# Patient Record
Sex: Female | Born: 2000 | Race: Asian | Hispanic: No | Marital: Single | Smoking: Never smoker
Health system: Southern US, Community
[De-identification: ages and names within clinical notes are randomized; demographics above are authoritative.]

## PROBLEM LIST (undated history)

## (undated) ENCOUNTER — Ambulatory Visit (HOSPITAL_COMMUNITY): Payer: Self-pay

## (undated) DIAGNOSIS — F32A Depression, unspecified: Secondary | ICD-10-CM

## (undated) DIAGNOSIS — F419 Anxiety disorder, unspecified: Secondary | ICD-10-CM

## (undated) HISTORY — DX: Anxiety disorder, unspecified: F41.9

## (undated) HISTORY — DX: Depression, unspecified: F32.A

---
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
## 2020-01-29 ENCOUNTER — Other Ambulatory Visit: Payer: Self-pay

## 2020-01-29 ENCOUNTER — Ambulatory Visit (INDEPENDENT_AMBULATORY_CARE_PROVIDER_SITE_OTHER): Payer: Medicaid Other | Admitting: Licensed Clinical Social Worker

## 2020-01-29 DIAGNOSIS — F411 Generalized anxiety disorder: Secondary | ICD-10-CM

## 2020-01-29 DIAGNOSIS — F331 Major depressive disorder, recurrent, moderate: Secondary | ICD-10-CM | POA: Diagnosis not present
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
## 2020-02-14 ENCOUNTER — Other Ambulatory Visit: Payer: Self-pay

## 2020-02-14 ENCOUNTER — Ambulatory Visit (INDEPENDENT_AMBULATORY_CARE_PROVIDER_SITE_OTHER): Payer: Medicaid Other | Admitting: Licensed Clinical Social Worker

## 2020-02-14 DIAGNOSIS — F411 Generalized anxiety disorder: Secondary | ICD-10-CM

## 2020-02-14 DIAGNOSIS — F331 Major depressive disorder, recurrent, moderate: Secondary | ICD-10-CM | POA: Diagnosis not present

## 2020-02-14 NOTE — Progress Notes (Signed)
° °  THERAPIST PROGRESS NOTE  Session Time: 33  Therapist Response:    Subjective/Objective:  Pt was alert and oriented x 5. She was dressed neat and clean. Pt presented with euthymic mood/affect. Rebecca Odonnell was cooperative and engaged well throughout f/u therapy session.   Pt primary stressor is school. She just got done taking her finals. Pt is having trouble looking at her grades because she gets tension and worry due to her concern of failing. She understands that she can complete her classes but still struggles with finding the motivation to do them. Pt states that she is increasing from 12 credit hours this past semester to 18 in spring. This is to help make up the classes she had to drop due to failing. Pt is more confident this time around as she is completing them with her friend.   Pt currently denies suicidal ideations. She states she had them about 2 weeks ago due to family conflict. Family conflict has since be resolved and pt feels like she is getting back on track. LCSW utilized supportive therapy to help pt through her stressors in todays session by letting pt expressed her frustration in session. Rebecca Odonnell reports that she felt better after session increasing her mood from a 4 to 6.    Assessment/Plan: Pt endorse symptoms for depression for sadness, mood swings, worthlessness and hopelessness. She currently meets criteria for MDD. Plan moving forward to keep offering supportive therapy with integrated CBT to help pt process through her stressors.  Participation Level: Active  Behavioral Response: Neat and Well GroomedAlertEuthymic  Type of Therapy: Individual Therapy  Treatment Goals addressed: Diagnosis: Major depression   Interventions: CBT and Supportive  Summary: Rebecca Odonnell is a 19 y.o. female who presents with MAD & GAD.   Suicidal/Homicidal: Yeswithout intent/plan    Plan: Return again in  weeks.      Weber Cooks, LCSW 02/14/2020

## 2020-03-04 ENCOUNTER — Other Ambulatory Visit: Payer: Self-pay

## 2020-03-04 ENCOUNTER — Ambulatory Visit (INDEPENDENT_AMBULATORY_CARE_PROVIDER_SITE_OTHER): Payer: Medicaid Other | Admitting: Licensed Clinical Social Worker

## 2020-03-04 DIAGNOSIS — F502 Bulimia nervosa, unspecified: Secondary | ICD-10-CM | POA: Insufficient documentation

## 2020-03-04 DIAGNOSIS — F331 Major depressive disorder, recurrent, moderate: Secondary | ICD-10-CM

## 2020-03-04 DIAGNOSIS — F411 Generalized anxiety disorder: Secondary | ICD-10-CM
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
## 2020-04-08 ENCOUNTER — Other Ambulatory Visit: Payer: Self-pay

## 2020-04-08 ENCOUNTER — Ambulatory Visit (INDEPENDENT_AMBULATORY_CARE_PROVIDER_SITE_OTHER): Payer: Medicaid Other | Admitting: Licensed Clinical Social Worker

## 2020-04-08 DIAGNOSIS — F502 Bulimia nervosa: Secondary | ICD-10-CM

## 2020-04-08 NOTE — Progress Notes (Signed)
   THERAPIST PROGRESS NOTE  Session Time: 59  Therapist Response:     Subjective/Objective:  Pt was alert and oriented x 5. She was dressed neat and clean. Pt presented today with euthymic mood/affect. Rebecca Odonnell was cooperative and maintained good eye contact.   Pt reports primary stressor is body image. She states that this next summer she will be going to [place] to get cosmetic surgery. When LCSW asked what kinds, pt states everything and anything that her parents can afford. Rebecca Odonnell states that this is a regular practice in her culture and body image is stressed. Rebecca Odonnell still reports she is hyper focused on getting to her ideal weight of 114 lb. but states that ideal weight for her parents is 88 lbs. Pt reports not being able to get on the scale because she is afraid of what she will see. She also struggles with getting dressed in the morning because she does not like looking in the mirror.    Assessment/Plan: Pt endorse symptoms for purging, body image dysphoria, anxiety, general discontent, guilt, or mood swings. Currently she meets criteria for bulimia nervosa. She is not taking any prescribed medication currently. Plan moving forward is 1 x weekly write down a positive trait about herself to help increase her self-confidence.  Participation Level: Active  Behavioral Response: Neat and Well GroomedAlertEuthymic  Type of Therapy: Individual Therapy  Treatment Goals addressed: Diagnosis: Major depression   Interventions: CBT and Supportive  Summary: Rebecca Odonnell is a 20 y.o. female who presents with Major depression, GAD .   Suicidal/Homicidal: Nowithout intent/plan    Plan: Return again in 4 weeks.      Weber Cooks, LCSW 04/08/2020

## 2020-05-06 ENCOUNTER — Other Ambulatory Visit: Payer: Self-pay

## 2020-05-06 ENCOUNTER — Ambulatory Visit (INDEPENDENT_AMBULATORY_CARE_PROVIDER_SITE_OTHER): Payer: Medicaid Other | Admitting: Licensed Clinical Social Worker

## 2020-05-06 DIAGNOSIS — F331 Major depressive disorder, recurrent, moderate: Secondary | ICD-10-CM | POA: Diagnosis not present

## 2020-05-06 DIAGNOSIS — F411 Generalized anxiety disorder: Secondary | ICD-10-CM

## 2020-05-06 DIAGNOSIS — F502 Bulimia nervosa: Secondary | ICD-10-CM

## 2020-05-06 NOTE — Progress Notes (Signed)
   THERAPIST PROGRESS NOTE  Session Time: 62   Therapist Response:    Subjective/Objective:  Pt was alert and oriented x 5. She was dressed neat and clean. Pt presented today euthymic mood/affect. Pt was cooperative and maintained good eye contact.   Pt primary stressor has been school. She is currently maxed out on credit hours. She explains that this is not because she wants too, but because she is bored. She has all As in her classes and 1 B. 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    Assessment/Plan: Pt endorse symptoms for worthlessness, hopelessness, fatigue, irritability, tension. She currently meets criteria for Major depression and GAD. Plan moving forward will be to reach out to her instructor where she has a B, to see what she can do better on. Pt score the same score on PHQ-9 and increase on the GAD-7, however pt did rate the difficulty to the 2nd lowest level on both which is an improvement.     Participation Level: Active  Behavioral Response: Casual, Neat and Well GroomedAlertEuthymic  Type of Therapy: Individual Therapy  Treatment Goals addressed: Diagnosis: major depression   Interventions: CBT and Supportive  Summary: Rebecca Odonnell is a 20 y.o. female who presents with major depression .   Suicidal/Homicidal: NAwithout intent/plan   Plan: Return again in  weeks.    Weber Cooks, LCSW 05/06/2020

## 2020-06-06 ENCOUNTER — Ambulatory Visit (INDEPENDENT_AMBULATORY_CARE_PROVIDER_SITE_OTHER): Payer: Medicaid Other | Admitting: Licensed Clinical Social Worker

## 2020-06-06 ENCOUNTER — Other Ambulatory Visit: Payer: Self-pay

## 2020-06-06 DIAGNOSIS — F331 Major depressive disorder, recurrent, moderate: Secondary | ICD-10-CM

## 2020-06-06 DIAGNOSIS — F411 Generalized anxiety disorder: Secondary | ICD-10-CM

## 2020-06-06 NOTE — Progress Notes (Signed)
   THERAPIST PROGRESS NOTE  Session Time: 80  Therapist Response:    Subjective/Objective:  Pt was alert and oriented x 5. She was dressed casually, neat, and clean. Pt presented with anxious, depressed, and tearful mood/affect. She was cooperative and engaged well in therapy.   Primary stressor for pt is friends, and body dysmorphia. Pt will be traveling to [place] next month. She will be visiting family and plans on having plastic surgery. She wants to get a nose job, pt reports that this is something that has been talked about in her life since she was 20 years old. She states that one of her aunts is helping connect her with the Psychologist, sport and exercise. She has never met the surgeon but states she has looked up his resume. Rebecca Odonnell was tearful talking about this as LCSW encouraged pt to be educated about the process, she felt that the conversation was very similar to one she had with her mom the week prior.   Pt also reports she is doing well in school. Her friends from school have been difficulty to deal with. Rebecca Odonnell reports that they will say mean and random things to her. Rebecca Odonnell does not say anything because she beans fits from them being around with transportation and pt wants to avoid conflict.    Assessment/Plan: Pt endorse symptoms of tearfulness, worthlessness, tension, worry, irritability, restlessness, and fatigue. Pt does meet criteria for MDD and GAD. She does report body dysmorphia with her weight want to be about 20lbs lighter and wants to get plastic surgery on her nose as she is not happy with it. Rebecca Odonnell does have Hx of purging food but states she has not done this in several months. Rebecca Odonnell does meet criteria for MDD and GAD. Plan will be to purge food 0/7 days per week, pt to write three positive things about herself weekly, pt to gather all material on plastic surgery and make pros/cons list.  Participation Level: Active  Behavioral Response: Casual, Neat and Well GroomedAlertAnxious and Depressed  Type  of Therapy: Individual Therapy  Treatment Goals addressed: Diagnosis: Major depression and GAD   Interventions: CBT and Supportive  Summary: Rebecca Odonnell is a 20 y.o. female who presents with Major depression and GAD  Suicidal/Homicidal: NAwithout intent/plan    Plan: Return again in 8 weeks.     Dory Horn, LCSW 06/06/2020

## 2020-07-08 ENCOUNTER — Ambulatory Visit (HOSPITAL_COMMUNITY): Payer: Medicaid Other | Admitting: Licensed Clinical Social Worker

## 2020-08-13 ENCOUNTER — Ambulatory Visit (HOSPITAL_COMMUNITY): Payer: Medicaid Other | Admitting: Licensed Clinical Social Worker

## 2021-05-05 ENCOUNTER — Encounter (HOSPITAL_COMMUNITY): Payer: Self-pay

## 2021-05-05 ENCOUNTER — Ambulatory Visit (HOSPITAL_COMMUNITY): Payer: Medicaid Other | Admitting: Clinical

## 2022-03-03 ENCOUNTER — Encounter (HOSPITAL_COMMUNITY): Payer: Self-pay

## 2022-03-03 ENCOUNTER — Ambulatory Visit (INDEPENDENT_AMBULATORY_CARE_PROVIDER_SITE_OTHER): Payer: Medicaid Other | Admitting: Clinical

## 2022-03-03 DIAGNOSIS — F331 Major depressive disorder, recurrent, moderate: Secondary | ICD-10-CM

## 2022-03-03 NOTE — Progress Notes (Signed)
Comprehensive Clinical Assessment (CCA) Note  03/03/2022 Rebecca Odonnell [id number]  Chief Complaint:  Chief Complaint  Patient presents with   Depression   Visit Diagnosis:   MDD, recurrent episode, moderate with anxious distress   Interpretive summary:  Client is a 22 year old female presenting to the [facility] for outpatient services.  Client is presenting by self-referral for assessment.  Client reported she is having recurring symptoms of anxiety and feeling overwhelmed.  Client reported her symptoms primarily pertaining to education.  Client reported she is in her senior year of school at [employer] as she is double Glass blower/designer in Development worker, international aid.  Client reported she has a lot on her agenda to get done has difficulty with the mental process of completing her schoolwork.  Client reported the anxiety is present but it is not debilitating.  Client reported she usually stays quiet and does not feel cool to talk with her parents and/or friends about how she is feeling because she does not want to be a burden.  Client reported her workload is fine but when it comes to particularly teachers that she does not respect she does not want to complete work for them.  Client reported her teachers that show favoritism caused her to feel negatively about wanting to engage in the class.  Client reported she knows that a part of her mindset that she needs to improve.  Client reported prior history of suicidal ideations and self harming 3 to 4 years ago and has had no issues with that since then.  Client denied use of illicit substances.  Client denied prior history of inpatient treatment for mental health reasons. Client presented to the appointment oriented x 5, appropriately dressed, and friendly.  Client denied hallucinations, delusions, suicidal and homicidal ideations.  Client was screened for pain, nutrition, [place] suicide severity and the following S DOH:     03/03/2022   11:21 AM 05/06/2020    2:19 PM 11/29/2019   11:10 AM  GAD 7 : Generalized Anxiety Score  Nervous, Anxious, on Edge 1 3 1   Control/stop worrying 1 2 2   Worry too much - different things 1 1 1   Trouble relaxing 1 2 2   Restless 1 0 2  Easily annoyed or irritable 1 3 2   Afraid - awful might happen 1 2 1   Total GAD 7 Score 7 13 11   Anxiety Difficulty Somewhat difficult Somewhat difficult Very difficult     Flowsheet Row Counselor from 05/06/2020 in [facility]  PHQ-9 Total Score 16        Tx Recommendations: counseling and medication management   CCA Biopsychosocial Intake/Chief Complaint:  Client reported she is presenting by self-referral.  Client is presenting with symptoms related to anxiety and stress.  Current Symptoms/Problems: Client reported feeling overwhelmed  Patient Reported Schizophrenia/Schizoaffective Diagnosis in Past: No  Strengths: vocalizing problems and needs  Preferences: Individual therapy  Abilities: Able to contribute to problem solving to plan resolution  Type of Services Patient Feels are Needed: therapy  Initial Clinical Notes/Concerns: No data recorded  Mental Health Symptoms Depression:   None   Duration of Depressive symptoms: No data recorded  Mania:   None   Anxiety:    Tension; Worrying; Difficulty concentrating   Psychosis:   None   Duration of Psychotic symptoms: No data recorded  Trauma:   None   Obsessions:   None   Compulsions:   None   Inattention:   None  Hyperactivity/Impulsivity:   None   Oppositional/Defiant Behaviors:   None   Emotional Irregularity:   None   Other Mood/Personality Symptoms:  No data recorded   Mental Status Exam Appearance and self-care  Stature:   Average   Weight:   Average weight   Clothing:   Casual   Grooming:   Normal   Cosmetic use:   Age appropriate   Posture/gait:   Normal   Motor activity:   Not Remarkable    Sensorium  Attention:   Normal   Concentration:   Normal   Orientation:   X5   Recall/memory:   Normal   Affect and Mood  Affect:   Congruent   Mood:   Euthymic   Relating  Eye contact:   None   Facial expression:   Anxious   Attitude toward examiner:   Cooperative   Thought and Language  Speech flow:  Clear and Coherent   Thought content:   Appropriate to Mood and Circumstances   Preoccupation:   None   Hallucinations:   None   Organization:  No data recorded  [employer] of Knowledge:   Good   Intelligence:   Average   Abstraction:   Normal   Judgement:   Good   Reality Testing:   Adequate   Insight:   Good   Decision Making:   Normal   Social Functioning  Social Maturity:   Responsible   Social Judgement:   Normal   Stress  Stressors:   School   Coping Ability:   Normal   Skill Deficits:   Activities of daily living   Supports:   Friends/Service system; Family     Religion: Religion/Spirituality Are You A Religious Person?: No  Leisure/Recreation: Leisure / Recreation Do You Have Hobbies?: Yes  Exercise/Diet: Exercise/Diet Do You Exercise?: Yes Have You Gained or Lost A Significant Amount of Weight in the Past Six Months?: No Do You Follow a Special Diet?: No Do You Have Any Trouble Sleeping?: Yes   CCA Employment/Education Employment/Work Situation: Employment / Work Situation Employment Situation: Ship broker  Education: Education Is Patient Currently Attending School?: Yes School Currently Attending: [employer] and political science Did Teacher, adult education From [employer]?: Yes Did Physicist, medical?: Yes   CCA Family/Childhood History Family and Relationship History: Family history Marital status: Single Does patient have children?: No  Childhood History:  Childhood History By whom was/is the patient raised?: Both parents Additional childhood history information: Client  reported she was born and raised in [place].  Client reported she has "strict" Asian parents.  Client reported she has a distant relationship with her father.  Client reported she does not speak Bhimani's very well and he does not speak much English so there may able to communicate with each other as she would like.  Client reported her mother imposes a lot of standards such as weight, appears to, employment and education.  Client reported she does have some good moments with her mother. Does patient have siblings?: Yes Number of Siblings: 1 Description of patient's current relationship with siblings: Client reported she has a cold or brother from her mom.  Client reported they are 9 years apart and do not have a close relationship. Did patient suffer any verbal/emotional/physical/sexual abuse as a child?: No Did patient suffer from severe childhood neglect?: No Has patient ever been sexually abused/assaulted/raped as an adolescent or adult?: No Was the patient ever a victim of a crime or a disaster?: No  Witnessed domestic violence?: No Has patient been affected by domestic violence as an adult?: No  Child/Adolescent Assessment:     CCA Substance Use Alcohol/Drug Use: Alcohol / Drug Use History of alcohol / drug use?: No history of alcohol / drug abuse                         ASAM's:  Six Dimensions of Multidimensional Assessment  Dimension 1:  Acute Intoxication and/or Withdrawal Potential:      Dimension 2:  Biomedical Conditions and Complications:      Dimension 3:  Emotional, Behavioral, or Cognitive Conditions and Complications:     Dimension 4:  Readiness to Change:     Dimension 5:  Relapse, Continued use, or Continued Problem Potential:     Dimension 6:  Recovery/Living Environment:     ASAM Severity Score:    ASAM Recommended Level of Treatment:     Substance use Disorder (SUD)    Recommendations for Services/Supports/Treatments: Recommendations for  Services/Supports/Treatments Recommendations For Services/Supports/Treatments: Individual Therapy  DSM5 Diagnoses: Patient Active Problem List   Diagnosis Date Noted   Bulimia nervosa 03/04/2020   Major depressive disorder, recurrent episode, moderate ([place]) 11/29/2019   GAD (generalized anxiety disorder) 11/29/2019    Patient Centered Plan: Patient is on the following Treatment Plan(s):  Depression   Referrals to Alternative Service(s): Referred to Alternative Service(s):   Place:   Date:   Time:    Referred to Alternative Service(s):   Place:   Date:   Time:    Referred to Alternative Service(s):   Place:   Date:   Time:    Referred to Alternative Service(s):   Place:   Date:   Time:      Collaboration of Care: Referral or follow-up with counselor/therapist AEB [facility]  Patient/Guardian was advised Release of Information must be obtained prior to any record release in order to collaborate their care with an outside provider. Patient/Guardian was advised if they have not already done so to contact the registration department to sign all necessary forms in order for [place] to release information regarding their care.   Consent: Patient/Guardian gives verbal consent for treatment and assignment of benefits for services provided during this visit. Patient/Guardian expressed understanding and agreed to proceed.   [place], LCSW

## 2022-03-30 ENCOUNTER — Ambulatory Visit (INDEPENDENT_AMBULATORY_CARE_PROVIDER_SITE_OTHER): Payer: Medicaid Other | Admitting: Clinical

## 2022-03-30 DIAGNOSIS — F411 Generalized anxiety disorder: Secondary | ICD-10-CM

## 2022-03-30 NOTE — Progress Notes (Signed)
   THERAPIST PROGRESS NOTE  Session Time: 30 minutes  Participation Level: Active  Behavioral Response: CasualAlertEuthymic  Type of Therapy: Individual Therapy  Treatment Goals addressed: client will practice problem solving skills 3 times per week for the next 4 weeks  ProgressTowards Goals: Progressing  Interventions: CBT and Supportive  Summary:  Rebecca Odonnell is a 22 y.o. adult who presents for scheduled appointment oriented x 5, appropriately dressed, and friendly.  Client denied hallucinations or delusions. Client reported on today she is feeling tired. Client reported she was up all night doing schoolwork.  Client reported she is scheduled to graduate in the spring but has decided to extend for 1 more semester to help improve her GPA.  Client reported she has decided not to look at her GPA because she does not want to stress herself out. Client reported living with her parents is her very stressful and she has decided post graduation she will not have contact with her parents at some point. Client reported they provide her various things but in a way that causes her to feel like she is in get to them and needs to pay them back.  Client reported she understands the logic with being immigrants but still does not agree with their logic. Client reported she has a resentment towards her parents.  Client reported she does want to work to be able to pay her parents money so I cannot say that she owes them anything.  Client reported she would like to start working but her school schedule is not refined in a way to where she feels organized between having a balance. Evidence of progress towards goal:  client reported 1 cognitive belief that contributes to her symptoms of depression and/or anxiety related to family dynamic.   Suicidal/Homicidal: Nowithout intent/plan  Therapist Response:  Therapist began the appointment asking the client how she has been doing since last seen. Therapist used  CBT to engage using active listening and positive emotional support. Therapist used CBT to engage and ask the client about her school and life balance. Therapist used CBT to ask the client to describe how her childhood/ current family dynamic contribute to her overall daily functioning. Therapist used CBT to discus time management and normalize the clients emotions and future goals. Therapist used CBT ask the client to identify her progress with frequency of use with coping skills with continued practice in her daily activity.    Therapist assigned the client homework to write out her school class schedule. Client was scheduled for next appointment.   Plan: Return again in 3 weeks.  Diagnosis: generalized anxiety disorder  Collaboration of Care: Patient refused AEB none requested by the client.  Patient/Guardian was advised Release of Information must be obtained prior to any record release in order to collaborate their care with an outside provider. Patient/Guardian was advised if they have not already done so to contact the registration department to sign all necessary forms in order for [place] to release information regarding their care.   Consent: Patient/Guardian gives verbal consent for treatment and assignment of benefits for services provided during this visit. Patient/Guardian expressed understanding and agreed to proceed.   [place], LCSW 03/30/2022

## 2022-04-13 ENCOUNTER — Ambulatory Visit (HOSPITAL_COMMUNITY): Payer: Medicaid Other | Admitting: Clinical

## 2022-04-29 ENCOUNTER — Ambulatory Visit (INDEPENDENT_AMBULATORY_CARE_PROVIDER_SITE_OTHER): Payer: Medicaid Other | Admitting: Clinical

## 2022-04-29 DIAGNOSIS — F411 Generalized anxiety disorder: Secondary | ICD-10-CM | POA: Diagnosis not present

## 2022-04-29 NOTE — Progress Notes (Signed)
   THERAPIST PROGRESS NOTE  Session Time: 40 minutes  Participation Level: Active  Behavioral Response: CasualAlertAnxious  Type of Therapy: Individual Therapy  Treatment Goals addressed: client will practice problem solving skills 3 times per week for the next 4 weeks  ProgressTowards Goals: Progressing  Interventions: CBT and Supportive  Summary:  Rebecca Odonnell is a 22 y.o. adult who presents for the scheduled appointment oriented x 5, appropriately dressed, and friendly.  Client denied hallucinations and delusions. Client reported on today she has not been doing so well.  Client reported she has been having some symptoms for a while and decided to go to the doctor about it.  Client reported her doctors found that she had a hormonal imbalance and could possibly have the diagnosis of her PCOS.  Client reported she has follow-up appointments regarding that.  Client reported she did inform her mother but her mother gave her the concern of not getting a medication that would cause her to gain weight.  Client reported her mother also had someone to reach out to her to advise her to take Ozempic because it would help with maintaining her weight.  Client reported it was not a surprise but she expressed her annoying about violating her personal information and placing her concern in the wrong place regarding her weight as opposed to her overall health.  Client reported otherwise majority of her classes she will pass but there is one class in particular that she has had difficulty with the motivation to go through.  Client reported she plans on taking classes in the summertime to help her GPA. Evidence of progress towards goal: Client reported 1 positive of following through with steps to manage her health.  Client also reported 1 cognitive pattern that interferes with her school performance negatively.  Suicidal/Homicidal: Nowithout intent/plan  Therapist Response:  Therapist began the appointment  asking the client how she has been doing since last seen. Therapist used CBT to engage using active listening and positive emotional support. Therapist used CBT to give the client time to discuss stressors regarding her health, family, and school. Therapist used CBT to positively reinforce the client's follow-through with recommendations to improve her health. Therapist used CBT to engage in helping the client to reframe thoughts about school to encourage her to finish the semester out the best of her ability. Therapist used CBT ask the client to identify her progress with frequency of use with coping skills with continued practice in her daily activity.    Therapist assigned client homework to practice self-care.    Plan: Return again in 3 weeks.  Diagnosis: GAD  Collaboration of Care: Patient refused AEB none requested by the client.  Patient/Guardian was advised Release of Information must be obtained prior to any record release in order to collaborate their care with an outside provider. Patient/Guardian was advised if they have not already done so to contact the registration department to sign all necessary forms in order for [place] to release information regarding their care.   Consent: Patient/Guardian gives verbal consent for treatment and assignment of benefits for services provided during this visit. Patient/Guardian expressed understanding and agreed to proceed.   [place], LCSW 04/29/2022

## 2022-05-21 ENCOUNTER — Ambulatory Visit (INDEPENDENT_AMBULATORY_CARE_PROVIDER_SITE_OTHER): Payer: Medicaid Other | Admitting: Clinical

## 2022-05-21 DIAGNOSIS — F331 Major depressive disorder, recurrent, moderate: Secondary | ICD-10-CM

## 2022-05-21 NOTE — Progress Notes (Signed)
THERAPIST PROGRESS NOTE Virtual Visit via Video Note  I connected with Rebecca Odonnell on 05/21/2022 at  9:00 AM EDT by a video enabled telemedicine application and verified that I am speaking with the correct person using two identifiers.  Location: Patient: home Provider: office   I discussed the limitations of evaluation and management by telemedicine and the availability of in person appointments. The patient expressed understanding and agreed to proceed.   Follow Up Instructions: I discussed the assessment and treatment plan with the patient. The patient was provided an opportunity to ask questions and all were answered. The patient agreed with the plan and demonstrated an understanding of the instructions.   The patient was advised to call back or seek an in-person evaluation if the symptoms worsen or if the condition fails to improve as anticipated.    Session Time: 45 minutes  Participation Level: Active  Behavioral Response: CasualAlertAnxious  Type of Therapy: Individual Therapy  Treatment Goals addressed: client will practice problem solving skills 3 times per week for the next 4 weeks  ProgressTowards Goals: Progressing  Interventions: CBT and Supportive  Summary:  Rebecca Odonnell is a 22 y.o. adult who presents for the scheduled appointment oriented x 5, appropriately dressed, and friendly.  Client denied hallucinations and delusions. Client reported on today she has been working client studying for the LSAT.  Client reported she has 3 weeks until she officially takes the test.  Client reported she has been spending several hours a day doing practice test and she is nervous about how she will perform with the time constraints as well as which sections will be created at which point will not be.  Client reported her parents have passed judgment about her just being able to stay in it correlating to her ability to obviously do well on the test.  Client reported that her  judgments and/or expectations can take away from the joy of her wanting to study law for herself.  Client reported she is currently fighting with her family.  Client reported her maternal grandmother is coming to visit from [place].  Client reported her grandma has always said negative things about her such as her appearance and her weight.  Client reported its never been set directly to her by her grandmother but she has heard from other people in the family.  Client reported she will also need to have a surgery to remove a mass from her breast.  Client reported she does not like that her family only shows some type of concern about her wellbeing when she is hurt. Evidence of progress towards goal: Client reported she practices 1 skill of reframing her negative thoughts with reminding herself of her own educational including goals.   Suicidal/Homicidal: Nowithout intent/plan  Therapist Response:  Therapist began the appointment asking the client how she has been doing. Therapist used CBT to engage using active listening and positive emotional support. Therapist used CBT to engage and give the client time to discuss her thoughts and feeling related to family, school, and her future. Therapist used CBT to engage and normalize her emotions and discuss reframing negative thoughts. Therapist used CBT ask the client to identify her progress with frequency of use with coping skills with continued practice in her daily activity.    Therapist assigned the client homework to practice self care.    Plan: Return again in 3 weeks.  Diagnosis: mdd, recurrent episode, moderate with anxious distress  Collaboration of Care: Patient refused AEB none  requested by the client.  Patient/Guardian was advised Release of Information must be obtained prior to any record release in order to collaborate their care with an outside provider. Patient/Guardian was advised if they have not already done so to contact the  registration department to sign all necessary forms in order for [place] to release information regarding their care.   Consent: Patient/Guardian gives verbal consent for treatment and assignment of benefits for services provided during this visit. Patient/Guardian expressed understanding and agreed to proceed.   [place], LCSW 05/21/2022

## 2022-06-05 ENCOUNTER — Ambulatory Visit (HOSPITAL_COMMUNITY): Payer: Medicaid Other | Admitting: Psychiatry
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
## 2022-06-15 ENCOUNTER — Encounter (HOSPITAL_COMMUNITY): Payer: Self-pay | Admitting: Psychiatry

## 2022-06-15 ENCOUNTER — Ambulatory Visit (INDEPENDENT_AMBULATORY_CARE_PROVIDER_SITE_OTHER): Payer: Medicaid Other | Admitting: Psychiatry

## 2022-06-15 VITALS — Wt 147.0 lb

## 2022-06-15 DIAGNOSIS — G478 Other sleep disorders: Secondary | ICD-10-CM

## 2022-06-15 DIAGNOSIS — F5089 Other specified eating disorder: Secondary | ICD-10-CM | POA: Diagnosis not present

## 2022-06-15 DIAGNOSIS — F329 Major depressive disorder, single episode, unspecified: Secondary | ICD-10-CM | POA: Diagnosis not present

## 2022-06-15 DIAGNOSIS — F411 Generalized anxiety disorder: Secondary | ICD-10-CM | POA: Diagnosis not present

## 2022-06-15 MED ORDER — MIRTAZAPINE 7.5 MG PO TABS: 7.5000 mg | ORAL_TABLET | Freq: Every day | ORAL | 2 refills | Status: DC

## 2022-06-15 NOTE — Patient Instructions (Signed)
Thank you for attending your appointment today.  -- START Remeron 7.5 mg nightly -- Continue other medications as prescribed.  Please do not make any changes to medications without first discussing with your provider. If you are experiencing a psychiatric emergency, please call 911 or present to your nearest emergency department. Additional crisis, medication management, and therapy resources are included below.  [facility]  [address] [place], [place] [address] [phone] WALK-IN URGENT CARE 24/7 FOR ANYONE [address] [place], [place]  [phone] Fax: [phone] [email or web address] *Interpreters available *Accepts all insurance and uninsured for Urgent Care needs *Accepts Medicaid and uninsured for outpatient treatment (below)      ONLY FOR [facility]  Below:    Outpatient New Patient Assessment/Therapy Walk-ins:        Monday -Thursday 8am until slots are full.        Every Friday 1pm-4pm  (first come, first served)                   New Patient Psychiatry/Medication Management        Monday-Friday 8am-11am (first come, first served)               For all walk-ins we ask that you arrive by 7:15am, because patients will be seen in the order of arrival.

## 2022-06-16 ENCOUNTER — Ambulatory Visit (INDEPENDENT_AMBULATORY_CARE_PROVIDER_SITE_OTHER): Payer: Medicaid Other | Admitting: Clinical

## 2022-06-16 DIAGNOSIS — F331 Major depressive disorder, recurrent, moderate: Secondary | ICD-10-CM

## 2022-06-16 NOTE — Progress Notes (Signed)
THERAPIST PROGRESS NOTE Virtual Visit via Video Note  I connected with Rebecca Odonnell on 06/16/22 at  9:00 AM EDT by a video enabled telemedicine application and verified that I am speaking with the correct person using two identifiers.  Location: Patient: home Provider: office   I discussed the limitations of evaluation and management by telemedicine and the availability of in person appointments. The patient expressed understanding and agreed to proceed.   Follow Up Instructions: I discussed the assessment and treatment plan with the patient. The patient was provided an opportunity to ask questions and all were answered. The patient agreed with the plan and demonstrated an understanding of the instructions.   The patient was advised to call back or seek an in-person evaluation if the symptoms worsen or if the condition fails to improve as anticipated.   Session Time: 40 minutes  Participation Level: Active  Behavioral Response: CasualAlertEuthymic  Type of Therapy: Individual Therapy  Treatment Goals addressed: client will practice problem solving skills 3 times per week for the next 4 weeks  ProgressTowards Goals: Progressing  Interventions: CBT and Supportive  Summary:  Rebecca Odonnell is a 22 y.o. adult who presents for the scheduled appointment oriented times five, appropriately dressed and friendly. Client denied hallucinations and delusions. Client reported she is doing fairly okay. Client reported she went to take her LSAT. Client reported she walked away from it feeling like she did better on the test than she expected. Client reported she is not expecting an amazing score but she feels it wont be her worst score. Client reported she got into a little argument with her parents before she took the exam. Client reported she feels dissociated from her parents. Client reported one of her issues with her parents is that they do not remember important dates for her. Client reported  they did not give positive acknowledgement of her testing and they do not remember her other health appointments. Client reported she has decided to volunteer and has been helping with [place] [employer] and an organization that hands out good to the homeless. 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 Client reported in doing so she has no plan of maintaining a relationship with her parents and has also considered cutting her relationship with her brother an cousins because she does not want her parents to have access to knowing about her life. Client reported the relationship with her brother and cousins is already distant. Client reported she does not like being snappy with her parents but living with them does more harm to her mental well being. Client reported she does not want to stay in state. Client reported if she ended up staying in [place] she would use her time to "figure out how to leave or kill myself". Client reported she does not have plan to harm herself right now and feels very optimistic about her ability to leave.  Evidence of progress towards goal:  Client reported 1 positive goal of planning for her future career and school goals.  Suicidal/Homicidal: Nowithout intent/plan  Therapist Response:  Therapist began the appointment asking the client how she has been doing since she was last seen. Therapist used CBT to engage using active listening and positive emotional support. Therapist used CBT to engage and ask the client open ended questions about how her testing went and her thought process working through school. Therapist used CBT to engage ask the client  how she is coping and thinking through stressors with her family and how it will affect her future. Therapist used CBT to engage and discuss stressors contributing to passive S.I and appropriately coping with thoughts/ emotions of hopelessness and anxiety. Therapist used  CBT to reinforce positive coping skills and problem solving skills. Therapist used CBT ask the client to identify her progress with frequency of use with coping skills with continued practice in her daily activity.    Therapist assigned the client homework to practice self care.    Plan: Return again in 3 weeks.  Diagnosis: major depressive disorder, recurrent episode, moderate with anxious distress  Collaboration of Care: Patient refused AEB none requested by the client.  Patient/Guardian was advised Release of Information must be obtained prior to any record release in order to collaborate their care with an outside provider. Patient/Guardian was advised if they have not already done so to contact the registration department to sign all necessary forms in order for [place] to release information regarding their care.   Consent: Patient/Guardian gives verbal consent for treatment and assignment of benefits for services provided during this visit. Patient/Guardian expressed understanding and agreed to proceed.   Neena Rhymes Hailly Fess, LCSW 06/16/2022

## 2022-07-01 ENCOUNTER — Ambulatory Visit (INDEPENDENT_AMBULATORY_CARE_PROVIDER_SITE_OTHER): Payer: Medicaid Other | Admitting: Clinical

## 2022-07-01 DIAGNOSIS — F331 Major depressive disorder, recurrent, moderate: Secondary | ICD-10-CM | POA: Diagnosis not present
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
## 2022-07-15 ENCOUNTER — Ambulatory Visit (HOSPITAL_COMMUNITY): Payer: Medicaid Other | Admitting: Clinical

## 2022-07-15 ENCOUNTER — Encounter (HOSPITAL_COMMUNITY): Payer: Self-pay | Admitting: Psychiatry

## 2022-07-15 ENCOUNTER — Telehealth (INDEPENDENT_AMBULATORY_CARE_PROVIDER_SITE_OTHER): Payer: Medicaid Other | Admitting: Psychiatry

## 2022-07-15 DIAGNOSIS — F5089 Other specified eating disorder: Secondary | ICD-10-CM | POA: Diagnosis not present

## 2022-07-15 DIAGNOSIS — F331 Major depressive disorder, recurrent, moderate: Secondary | ICD-10-CM | POA: Diagnosis not present

## 2022-07-15 DIAGNOSIS — F411 Generalized anxiety disorder: Secondary | ICD-10-CM | POA: Diagnosis not present

## 2022-07-15 MED ORDER — MIRTAZAPINE 15 MG PO TABS: 15.0000 mg | ORAL_TABLET | Freq: Every day | ORAL | 2 refills | Status: DC

## 2022-07-15 NOTE — Patient Instructions (Signed)
Thank you for attending your appointment today.  -- INCREASE Remeron to 15 mg nightly -- Continue other medications as prescribed.  Please do not make any changes to medications without first discussing with your provider. If you are experiencing a psychiatric emergency, please call 911 or present to your nearest emergency department. Additional crisis, medication management, and therapy resources are included below.  [facility]  [address], [place], [place] [address] [phone] WALK-IN URGENT CARE 24/7 FOR ANYONE [address] [place], [place]  [phone] Fax: [phone] [email or web address] *Interpreters available *Accepts all insurance and uninsured for Urgent Care needs *Accepts Medicaid and uninsured for outpatient treatment (below)      ONLY FOR [facility]  Below:    Outpatient New Patient Assessment/Therapy Walk-ins:        Monday -Thursday 8am until slots are full.        Every Friday 1pm-4pm  (first come, first served)                   New Patient Psychiatry/Medication Management        Monday-Friday 8am-11am (first come, first served)               For all walk-ins we ask that you arrive by 7:15am, because patients will be seen in the order of arrival.
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
## 2022-07-30 ENCOUNTER — Ambulatory Visit (HOSPITAL_COMMUNITY): Payer: Medicaid Other | Admitting: Clinical

## 2022-07-30 ENCOUNTER — Encounter (HOSPITAL_COMMUNITY): Payer: Self-pay

## 2022-08-18 ENCOUNTER — Ambulatory Visit (INDEPENDENT_AMBULATORY_CARE_PROVIDER_SITE_OTHER): Payer: Medicaid Other | Admitting: Clinical

## 2022-08-18 ENCOUNTER — Encounter (HOSPITAL_COMMUNITY): Payer: Self-pay

## 2022-08-18 DIAGNOSIS — F331 Major depressive disorder, recurrent, moderate: Secondary | ICD-10-CM

## 2022-08-18 NOTE — Progress Notes (Signed)
THERAPIST PROGRESS NOTE Virtual Visit via Video Note  I connected with Arleane Spinale on 08/18/22 at  2:00 PM EDT by a video enabled telemedicine application and verified that I am speaking with the correct person using two identifiers.  Location: Patient: home Provider: office   I discussed the limitations of evaluation and management by telemedicine and the availability of in person appointments. The patient expressed understanding and agreed to proceed.   Follow Up Instructions: I discussed the assessment and treatment plan with the patient. The patient was provided an opportunity to ask questions and all were answered. The patient agreed with the plan and demonstrated an understanding of the instructions.   The patient was advised to call back or seek an in-person evaluation if the symptoms worsen or if the condition fails to improve as anticipated.   Session Time: 45 minutes  Participation Level: Active  Behavioral Response: CasualAlertDepressed  Type of Therapy: Individual Therapy  Treatment Goals addressed: client will practice problem solving skills 3 times per week for the next 4 weeks  ProgressTowards Goals: Progressing  Interventions: CBT and Supportive  Summary:  Nuzhat Dagle is a 22 y.o. adult who presents for the scheduled appointment oriented times five, appropriately dressed and friendly. Client denied hallucinations and delusions. Client reported on today she is doing fairly okay. Client reported she is on medications that make her drowsy which is why she missed her last therapy appointment. Client reported her grandmother and female cousin came to visit and left. Client reported ultimately she did not have to agree to marry him to help him stay in the country. Client reported she blocked him from being able to contact her again. Client reported her mother recently returned from [place] and thinks she was influenced from seeing her friends having good relationships  with their children. Client reported her mother started an argument about why she never tells her she loves her and other things. Client reported she has lost affection for her parents due to their parenting style. Client reported she understands that they didn't mean for he to feel so little of herself. Client reported it is because of how they were raised. Client reported she has been brainstorming how she will cut communication from her parents when the time comes that she leaves. Client reported otherwise she has been taking care of her health. Client reported she was diagnosed with a specific form of lupus. Client reported she is being managed on medication and doing well with that. Evidence of progress towards goal:  client reported 1 positive which is effectively applying boundaries and communication to help with conflict resolution.  Suicidal/Homicidal: Nowithout intent/plan  Therapist Response:  Therapist began the appointment asking the client how she has been doing. Therapist used CBT to engage using active listening and positive emotional support. Therapist used CBT to ask the client open ended questions about psychosocial stressors and its affect on her thoughts and emotions. Therapist used CBT to engage and ask her to describe how she is processing and coping with family and school stressors. Therapist used CBT to reinforce boundaries and positive self esteem. Therapist used CBT ask the client to identify her progress with frequency of use with coping skills with continued practice in her daily activity.    Therapist assigned the client homework to practice self care.    Plan: Return again in 3 weeks.  Diagnosis: major depressive disorder, recurrent episode, moderate  Collaboration of Care: Patient refused AEB none requested by the client.  Patient/Guardian was advised Release of Information must be obtained prior to any record release in order to collaborate their care with an  outside provider. Patient/Guardian was advised if they have not already done so to contact the registration department to sign all necessary forms in order for [place] to release information regarding their care.   Consent: Patient/Guardian gives verbal consent for treatment and assignment of benefits for services provided during this visit. Patient/Guardian expressed understanding and agreed to proceed.   Neena Rhymes Maykayla Highley, LCSW 08/18/2022

## 2022-08-26 ENCOUNTER — Encounter (HOSPITAL_COMMUNITY): Payer: Self-pay

## 2022-09-02 ENCOUNTER — Telehealth (HOSPITAL_COMMUNITY): Payer: Medicaid Other | Admitting: Psychiatry

## 2022-09-09 ENCOUNTER — Ambulatory Visit (INDEPENDENT_AMBULATORY_CARE_PROVIDER_SITE_OTHER): Payer: Medicaid Other | Admitting: Clinical

## 2022-09-09 DIAGNOSIS — F331 Major depressive disorder, recurrent, moderate: Secondary | ICD-10-CM | POA: Diagnosis not present

## 2022-09-10 NOTE — Progress Notes (Signed)
THERAPIST PROGRESS NOTE Virtual Visit via Video Note  I connected with Rebecca Odonnell on 09/09/2022 at  3:00 PM EDT by a video enabled telemedicine application and verified that I am speaking with the correct person using two identifiers.  Location: Patient: home Provider: office   I discussed the limitations of evaluation and management by telemedicine and the availability of in person appointments. The patient expressed understanding and agreed to proceed.   Follow Up Instructions: I discussed the assessment and treatment plan with the patient. The patient was provided an opportunity to ask questions and all were answered. The patient agreed with the plan and demonstrated an understanding of the instructions.   The patient was advised to call back or seek an in-person evaluation if the symptoms worsen or if the condition fails to improve as anticipated.    Session Time: 45 minutes  Participation Level: Active  Behavioral Response: CasualAlertDepressed  Type of Therapy: Individual Therapy  Treatment Goals addressed: client will practice problem solving skills 3 times per week for the next 4 weeks  ProgressTowards Goals: Progressing  Interventions: CBT and Supportive  Summary:  Rebecca Odonnell is a 22 y.o. adult who presents for the scheduled appointment oriented times five, appropriately dressed and friendly. Client denied hallucinations and delusions. Client reported she has still had a mix of emotions between anxious and depressed. Client reported she found out her brother was cheating on his girlfriend whom she'd become find of and it has changed her perspective of him. Client reported this news has also changed her parents to beng nicer to her. Client reported it has  given her some momentary hesitation about going no contact from them. Client reported her plan is to move states when she goes to graduate school and not tell her parents. Client reported she has mixed emotions of  depression, anxiety and grief while transitioning to the mindset that she will not be around them ne day. Client reported although she has no emotional affection for her parents she still worries about how they will be cared for as they do not speak fluent english. Client reported her depression also comes from the realization that she feels orphaned in a way.  Evidence of progress towards goal:  client reported she use 3 journals to write out her triggers and thoughts/ emotions.  Suicidal/Homicidal: Nowithout intent/plan  Therapist Response:  Therapist began the appointment asking the client how she has been doing. Therapist used CBT to engage using active listening and positive emotional support. Therapist used CBT to engage and give the client time to discuss her thoughts and feelings about family. Therapist used CBT to discuss making logical decisions about her future. Therapist used CBT ask the client to identify her progress with frequency of use with coping skills with continued practice in her daily activity.    Therapist assigned the client homework to consider pros and cons of her decisions she is contemplating.  Plan: Return again in 4 weeks.  Diagnosis: major depressive disorder, recurrent episode, moderate  Collaboration of Care: Patient refused AEB none requested by the client.  Patient/Guardian was advised Release of Information must be obtained prior to any record release in order to collaborate their care with an outside provider. Patient/Guardian was advised if they have not already done so to contact the registration department to sign all necessary forms in order for [place] to release information regarding their care.   Consent: Patient/Guardian gives verbal consent for treatment and assignment of benefits for services provided  during this visit. Patient/Guardian expressed understanding and agreed to proceed.   Neena Rhymes Paulette Rockford, LCSW 09/09/2022

## 2022-10-15 ENCOUNTER — Encounter (HOSPITAL_COMMUNITY): Payer: Self-pay

## 2022-10-15 ENCOUNTER — Ambulatory Visit (HOSPITAL_COMMUNITY): Payer: Medicaid Other | Admitting: Clinical

## 2022-11-12 ENCOUNTER — Ambulatory Visit (INDEPENDENT_AMBULATORY_CARE_PROVIDER_SITE_OTHER): Payer: Medicaid Other | Admitting: Clinical

## 2022-11-12 DIAGNOSIS — F331 Major depressive disorder, recurrent, moderate: Secondary | ICD-10-CM | POA: Diagnosis not present

## 2022-11-12 NOTE — Progress Notes (Signed)
THERAPIST PROGRESS NOTE Virtual Visit via Video Note  I connected with Rebecca Odonnell on 11/12/2022 at 11:00 AM EDT by a video enabled telemedicine application and verified that I am speaking with the correct person using two identifiers.  Location: Patient: Home Provider: Office   I discussed the limitations of evaluation and management by telemedicine and the availability of in person appointments. The patient expressed understanding and agreed to proceed.   Follow Up Instructions: I discussed the assessment and treatment plan with the patient. The patient was provided an opportunity to ask questions and all were answered. The patient agreed with the plan and demonstrated an understanding of the instructions.   The patient was advised to call back or seek an in-person evaluation if the symptoms worsen or if the condition fails to improve as anticipated.  Session Time: 45 minutes  Participation Level: Active  Behavioral Response: CasualAlertEuthymic  Type of Therapy: Individual Therapy  Treatment Goals addressed: Client will practice problem-solving skills that is also reported as 4 weeks  ProgressTowards Goals: Progressing  Interventions: CBT and Supportive  Summary:  Rebecca Odonnell is a 22 y.o. adult who presents for the scheduled appointment oriented x 5, appropriately dressed, and friendly.  Client denied hallucinations and delusions. Client reported on today a lot has been going on.  Client reported her birthday is coming up and it is always a difficult time.  Client reported she usually gets the "birthday blues".  Client reported as a child her parents did not celebrate birthdays.  Client reported that a traditional way of celebrating his to not do so.  Client reported it makes her sad around her birthday because she already does not feel seen by her family.  Client reported she has tried to enlighten her friends on how to support her during her birthdays because she does not  want to feel the same way by them on her birthday.  Client reported she is not sure how much will be to much to talk to her friends to celebrate her birthday without putting unrealistic expectations on them. Client reported she has a long distance female friend who has mental health history. Client reported having thoughts that he is being reliant upon her to be in a good mental space. Client reported her friends have told her she needs to stop talking to him. Evidence of progress towards goal: Client reported 1 positive which is considering identifying her unrealistic or negative thinking styles and reframing them so they do not interfere with her interpersonal relationships negatively.  Suicidal/Homicidal: Nowithout intent/plan  Therapist Response:  Therapist began the appointment asking the client how she has been doing since last seen. Therapist used CBT to engage using active listening and positive emotional support towards her thoughts and feelings. Therapist used CBT to engage and asked the client about changes that have transpired in her interpersonal relationships and psychosocial stressors. Therapist used CBT to help the client identify her thinking styles that she believes is a barrier to her having improved perception of herself within her interpersonal relationships. Therapist used CBT to engage the client and brainstorm how she can reframe unhelpful thinking styles. Therapist used CBT ask the client to identify her progress with frequency of use with coping skills with continued practice in her daily activity.    Therapist assigned the client homework to practice the skills that were discussed.   Plan: Return again in 4 weeks.  Diagnosis: Major depressive disorder, recurrent episode, moderate  Collaboration of Care: Patient refused  AEB none requested by the client.  Patient/Guardian was advised Release of Information must be obtained prior to any record release in order to  collaborate their care with an outside provider. Patient/Guardian was advised if they have not already done so to contact the registration department to sign all necessary forms in order for [place] to release information regarding their care.   Consent: Patient/Guardian gives verbal consent for treatment and assignment of benefits for services provided during this visit. Patient/Guardian expressed understanding and agreed to proceed.   Neena Rhymes Elfrida Pixley, LCSW 11/12/2022

## 2022-12-09 ENCOUNTER — Ambulatory Visit (INDEPENDENT_AMBULATORY_CARE_PROVIDER_SITE_OTHER): Payer: BC Managed Care – PPO | Admitting: Clinical

## 2022-12-09 DIAGNOSIS — F411 Generalized anxiety disorder: Secondary | ICD-10-CM | POA: Diagnosis not present

## 2022-12-09 NOTE — Progress Notes (Signed)
THERAPIST PROGRESS NOTE Virtual Visit via Video Note  I connected with Rebecca Odonnell on 12/09/22 at 11:00 AM EDT by a video enabled telemedicine application and verified that I am speaking with the correct person using two identifiers.  Location: Patient: home Provider: office   I discussed the limitations of evaluation and management by telemedicine and the availability of in person appointments. The patient expressed understanding and agreed to proceed.  Follow Up Instructions: I discussed the assessment and treatment plan with the patient. The patient was provided an opportunity to ask questions and all were answered. The patient agreed with the plan and demonstrated an understanding of the instructions.   The patient was advised to call back or seek an in-person evaluation if the symptoms worsen or if the condition fails to improve as anticipated.   Session Time: 45 minutes  Participation Level: Active  Behavioral Response: CasualAlertEuthymic  Type of Therapy: Individual Therapy  Treatment Goals addressed: Rebecca Odonnell will reduce frequency of avoidant behaviors by 50% as evidenced by self-report in therapy sessions   ProgressTowards Goals: Progressing  Interventions: CBT and Supportive  Summary:  Rebecca Odonnell is a 22 y.o. adult who presents for the scheduled appointment oriented x 5, appropriately dressed, and friendly.  Client denied hallucinations and delusions. Client reported today she is doing pretty well.  Client reported with school her grades have not been that great.  Client reported she continues to struggle with physically presenting to class and even interacting with her professors via email.  Client reported she would feel awkward trying to explain to the teacher that she has trauma from a interaction with teachers and high school that has continued to affect her education experience.  Client reported she wants to get past that. Client reported she also has had some issue  with the long distance friend in [place]. Client reported she has been taking the thought seriously of distancing herself from him. Client reported she has tried to be empathetic but feels he tries to bait her into staying around. Client reported she continues to think about her moves to another state. Evidence of progress towards goal:  client reported she has been practicing boundaries in at least 1 interpersonal relationship.  Suicidal/Homicidal: Nowithout intent/plan  Therapist Response:  Therapist began the appointment asking the client how she has been doing since last seen. Therapist used CBT to engage using active listening and positive emotional support. Therapist used CBT to give the client time to discuss her thoughts and feelings about stressors within interpersonal relationships, school and family. Therapist used CBT to normalize the clients emotional response and engage with continuing to teach her about boundaries. Therapist used CBT ask the client to identify her progress with frequency of use with coping skills with continued practice in her daily activity.    Therapist assigned client homework to continue practicing boundaries as well as identifying negative thoughts that are related to her trauma with school.   Plan: Return again in 4 weeks.  Diagnosis: GAD  Collaboration of Care: Other therapist contacted the administrative staff to have the client rescheduled for medication management appointment as the client requested.  Patient/Guardian was advised Release of Information must be obtained prior to any record release in order to collaborate their care with an outside provider. Patient/Guardian was advised if they have not already done so to contact the registration department to sign all necessary forms in order for [place] to release information regarding their care.   Consent: Patient/Guardian gives verbal consent  for treatment and assignment of benefits for services provided  during this visit. Patient/Guardian expressed understanding and agreed to proceed.   Rebecca Rhymes Alaia Lordi, LCSW 12/09/2022

## 2022-12-23 ENCOUNTER — Ambulatory Visit (INDEPENDENT_AMBULATORY_CARE_PROVIDER_SITE_OTHER): Payer: BC Managed Care – PPO | Admitting: Clinical

## 2022-12-23 DIAGNOSIS — F411 Generalized anxiety disorder: Secondary | ICD-10-CM | POA: Diagnosis not present

## 2022-12-26 NOTE — Progress Notes (Signed)
THERAPIST PROGRESS NOTE Virtual Visit via Video Note  I connected with Rebecca Odonnell on 12/23/2022 at  4:00 PM EDT by a video enabled telemedicine application and verified that I am speaking with the correct person using two identifiers.  Location: Patient: home Provider: office   I discussed the limitations of evaluation and management by telemedicine and the availability of in person appointments. The patient expressed understanding and agreed to proceed.   Follow Up Instructions: I discussed the assessment and treatment plan with the patient. The patient was provided an opportunity to ask questions and all were answered. The patient agreed with the plan and demonstrated an understanding of the instructions.   The patient was advised to call back or seek an in-person evaluation if the symptoms worsen or if the condition fails to improve as anticipated.   Session Time: 60 minutes  Participation Level: Active  Behavioral Response: CasualAlertDepressed  Type of Therapy: Individual Therapy  Treatment Goals addressed:  Aquia will reduce frequency of avoidant behaviors by 50% as evidenced by self-report in therapy sessions   ProgressTowards Goals: Not Progressing  Interventions: CBT and Supportive  Summary:  Rebecca Odonnell is a 22 y.o. adult who presents for the appointment oriented x 5, appropriately dressed, and friendly.  Client denied hallucinations and delusions. Client reported on today she has been doing client's thoughts and emotions since last appointment. Client reported she did not go to her midterm. Client reported she has still avoided reaching out to her teachers about her grades. Client reported she is still working through getting past the anxiety of interacting with teachers due to past trauma. Client reported recent news about celebrities passing of alleged S.I caused her to reflect on her own experience with a friend who passed the same way. Client reported hx of self  harm after the friends passing and passive S.I. client reported she struggle with anxiety from being triggered but has since calmed down. Client reported she has also been thinking about her dependency in her friendships as well. Client reported one of her fears is not finding people who understand her and what she has been through. Client reported she still goes back and forth with thinking about moving in her future compared to how her parents will be looked after.  Evidence of progress towards goal:  client reported 1 trigger for depression recently as it relates to her past experiences.  Client reported 1 situation of avoidance from addressing her issues with class attendance with her teacher.                                                   Suicidal/Homicidal: Nowithout intent/plan  Therapist Response:  Therapist began the appointment asking the client how she has been. Therapist used cbt to engage using active listening and positive emotional support. Therapist used cbt to engage and give the client time to discuss her thoughts and feelings about recent concerns. Therapist used cbt to empathize with her emotions. Therapist used cbt to collaborate with the client to discuss how she can process her emotions with school separately from the present and help her restructure her thoughts. Therapist used CBT ask the client to identify her progress with frequency of use with coping skills with continued practice in her daily activity.      Plan: Return again in 4 weeks.  Diagnosis: generalized anxiety disorder  Collaboration of Care: Patient refused AEB none requested by the client.  Patient/Guardian was advised Release of Information must be obtained prior to any record release in order to collaborate their care with an outside provider. Patient/Guardian was advised if they have not already done so to contact the registration department to sign all necessary forms in order for [place] to release  information regarding their care.   Consent: Patient/Guardian gives verbal consent for treatment and assignment of benefits for services provided during this visit. Patient/Guardian expressed understanding and agreed to proceed.   Neena Rhymes Jabarie Pop, LCSW 12/23/2022

## 2022-12-30 ENCOUNTER — Ambulatory Visit (HOSPITAL_COMMUNITY): Payer: BC Managed Care – PPO | Admitting: Clinical

## 2022-12-30 DIAGNOSIS — F331 Major depressive disorder, recurrent, moderate: Secondary | ICD-10-CM | POA: Diagnosis not present

## 2022-12-30 NOTE — Progress Notes (Signed)
THERAPIST PROGRESS NOTE Virtual Visit via Video Note  I connected with Rebecca Odonnell on 12/30/22 at  1:00 PM EDT by a video enabled telemedicine application and verified that I am speaking with the correct person using two identifiers.  Location: Patient: home Provider: office   I discussed the limitations of evaluation and management by telemedicine and the availability of in person appointments. The patient expressed understanding and agreed to proceed.   Follow Up Instructions: I discussed the assessment and treatment plan with the patient. The patient was provided an opportunity to ask questions and all were answered. The patient agreed with the plan and demonstrated an understanding of the instructions.   The patient was advised to call back or seek an in-person evaluation if the symptoms worsen or if the condition fails to improve as anticipated.   Session Time: 45 minutes  Participation Level: Active  Behavioral Response: CasualAlertAnxious  Type of Therapy: Individual Therapy  Treatment Goals addressed:  Rebecca Odonnell will practice problem solving skills 3 times per week for the next 4 weeks.   ProgressTowards Goals: Progressing  Interventions: CBT  Summary:  Rebecca Odonnell is a 22 y.o. adult who presents for the scheduled appointment oriented x 5, appropriately dressed, and friendly.  Client denied hallucinations and delusions. Client reported on today she has been maintaining fairly okay.  Client reported there is clinically some changes in her brother will be moving back home.  Client reported her brother and girlfriend broke up so now he will be back in the house.  Client reported that brings about some negative thoughts and feelings about having the impression that she is not so much liked by her mother but believes that she is her dad's favorite because she is his biological child and her brothers from a previous marriage so her mother tends to be more nice to him.  Client  reported she does have passive suicidal thoughts that pop up infrequently when she is triggered by certain conversations with her family which pertain to their expectations, cultural goals and comparison between her and other family members.  Client reported they are just thoughts and she would never or has member had a plan or intent to harm herself but feeling overwhelmed caused her too much stress.  Client reported she did stop her friendship with the female friend who is in [place].  Client reported he took it better than she thought and she does feel free since not being in communication with him.  Client reported she did feel some guilt because she did not help hold doing certain things for him such as being better for him.  Client reported she does see that sometimes although someone likewise is dealing with mental health symptoms that she identifies with it is not always a good thing to hold relationships with them. Evidence of progress towards goal: Client reported 1 barrier to improving and her education is her difficult ability to have interaction with professors that she wants to work on.  Suicidal/Homicidal: Nowithout intent/plan  Therapist Response:  Therapist began the appointment asking the client how she has been doing since last seen. Therapist used CBT to engage using active listening and positive emotional support towards her thoughts and feelings. Therapist used CBT to ask the client about changes that have occurred both positive and negative and how she perceives to be coping with it. Therapist used CBT to normalize the clients emotional response within reason. Therapist used CBT to ask the client clarifying questions about triggers  and or barriers that lead to passive suicidal ideations and/or avoidance of problem solving certain stressors. Therapist used CBT to continue to teach the client about boundaries and self-confidence. Therapist used CBT ask the client to identify her  progress with frequency of use with coping skills with continued practice in her daily activity.      Plan: Return again in 4 weeks.  Diagnosis: Major depressive disorder, recurrent episode, moderate  Collaboration of Care: Patient refused AEB none requested by the client.  Patient/Guardian was advised Release of Information must be obtained prior to any record release in order to collaborate their care with an outside provider. Patient/Guardian was advised if they have not already done so to contact the registration department to sign all necessary forms in order for [place] to release information regarding their care.   Consent: Patient/Guardian gives verbal consent for treatment and assignment of benefits for services provided during this visit. Patient/Guardian expressed understanding and agreed to proceed.   Rebecca Rhymes Kaianna Dolezal, LCSW 12/30/2022
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
## 2023-01-05 ENCOUNTER — Ambulatory Visit (HOSPITAL_COMMUNITY): Payer: BC Managed Care – PPO | Admitting: Clinical

## 2023-01-06 ENCOUNTER — Telehealth (HOSPITAL_COMMUNITY): Payer: BC Managed Care – PPO | Admitting: Psychiatry

## 2023-01-06 ENCOUNTER — Encounter (HOSPITAL_COMMUNITY): Payer: Self-pay | Admitting: Psychiatry

## 2023-01-06 DIAGNOSIS — F5089 Other specified eating disorder: Secondary | ICD-10-CM | POA: Diagnosis not present

## 2023-01-06 DIAGNOSIS — F331 Major depressive disorder, recurrent, moderate: Secondary | ICD-10-CM | POA: Diagnosis not present

## 2023-01-06 DIAGNOSIS — F411 Generalized anxiety disorder: Secondary | ICD-10-CM

## 2023-01-06 MED ORDER — FLUOXETINE HCL 20 MG PO CAPS: 20.0000 mg | ORAL_CAPSULE | Freq: Every day | ORAL | 1 refills | Status: DC

## 2023-01-06 NOTE — Patient Instructions (Signed)
Thank you for attending your appointment today.  -- START Prozac 20 mg daily -- Continue other medications as prescribed.  Please do not make any changes to medications without first discussing with your provider. If you are experiencing a psychiatric emergency, please call 911 or present to your nearest emergency department. Additional crisis, medication management, and therapy resources are included below.  [facility]  [address], [place], [place] [address] [phone] WALK-IN URGENT CARE 24/7 FOR ANYONE [address], [place], [place]  [phone] Fax: [phone] [email or web address] *Interpreters available *Accepts all insurance and uninsured for Urgent Care needs *Accepts Medicaid and uninsured for outpatient treatment (below)      ONLY FOR [facility]  Below:    Outpatient New Patient Assessment/Therapy Walk-ins:        Monday -Thursday 8am until slots are full.        Every Friday 1pm-4pm  (first come, first served)                   New Patient Psychiatry/Medication Management        Monday-Friday 8am-11am (first come, first served)               For all walk-ins we ask that you arrive by 7:15am, because patients will be seen in the order of arrival.

## 2023-01-20 ENCOUNTER — Ambulatory Visit (INDEPENDENT_AMBULATORY_CARE_PROVIDER_SITE_OTHER): Payer: BC Managed Care – PPO | Admitting: Clinical

## 2023-01-20 DIAGNOSIS — F411 Generalized anxiety disorder: Secondary | ICD-10-CM | POA: Diagnosis not present

## 2023-01-20 NOTE — Progress Notes (Signed)
   THERAPIST PROGRESS NOTE Virtual Visit via Video Note  I connected with Rebecca Odonnell on 01/20/2023 at  3:00 PM EST by a video enabled telemedicine application and verified that I am speaking with the correct person using two identifiers.  Location: Patient: Home Provider: Office   I discussed the limitations of evaluation and management by telemedicine and the availability of in person appointments. The patient expressed understanding and agreed to proceed.   Follow Up Instructions: I discussed the assessment and treatment plan with the patient. The patient was provided an opportunity to ask questions and all were answered. The patient agreed with the plan and demonstrated an understanding of the instructions.   The patient was advised to call back or seek an in-person evaluation if the symptoms worsen or if the condition fails to improve as anticipated.    Session Time: 45 minutes  Participation Level: Active  Behavioral Response: CasualAlertAnxious  Type of Therapy: Individual Therapy  Treatment Goals addressed: Eulogia will practice problem solving skills 3 times per week for the next 4 weeks.   ProgressTowards Goals: Progressing  Interventions: CBT  Summary:  Rebecca Odonnell is a 22 y.o. adult who presents for the scheduled appointment oriented x 5, appropriately dressed, and friendly.  Client denied hallucinations and delusions. Client reported on today she has been doing fairly okay.  Client reported last week she did have a rough patch when talking with her cousin about her anticipated graduation date.  Client reported some anxiety about being able to continue through school and figuring out how to interact with her teachers.  Client reported she ideally needs to construct an email to her professors to give an excuse for her missed attendance in the class.  Client reported she even has a hard time writing an email to her professor and/or having to read a response from  him. Evidence of progress towards goal: Client reported 1 continue barrier to working through her anxiety and depressive symptoms is interacting with teachers due to past trauma.  Suicidal/Homicidal: Nowithout intent/plan  Therapist Response:  Therapist began the appointment asking the client how she has been doing since last seen. Therapist used CBT to engage using active listening and positive emotional support. Therapist used CBT to get the client time to discuss her thoughts and feelings about family and school stressors. Therapist used CBT to discuss with the client about working through her barriers of engaging in school how she would like to and brainstorm how to do that. Therapist used CBT ask the client to identify her progress with frequency of use with coping skills with continued practice in her daily activity.    Therapist assigned client homework to construct an email that she can send to her professor.   Plan: Return again in 3 weeks.  Diagnosis: Generalized anxiety disorder  Collaboration of Care: Patient refused AEB none requested by the client.  Patient/Guardian was advised Release of Information must be obtained prior to any record release in order to collaborate their care with an outside provider. Patient/Guardian was advised if they have not already done so to contact the registration department to sign all necessary forms in order for [place] to release information regarding their care.   Consent: Patient/Guardian gives verbal consent for treatment and assignment of benefits for services provided during this visit. Patient/Guardian expressed understanding and agreed to proceed.   Neena Rhymes Caspian Deleonardis, LCSW 01/20/2023

## 2023-02-03 ENCOUNTER — Ambulatory Visit (INDEPENDENT_AMBULATORY_CARE_PROVIDER_SITE_OTHER): Payer: BC Managed Care – PPO | Admitting: Clinical

## 2023-02-03 DIAGNOSIS — F411 Generalized anxiety disorder: Secondary | ICD-10-CM | POA: Diagnosis not present

## 2023-02-03 NOTE — Progress Notes (Signed)
THERAPIST PROGRESS NOTE Virtual Visit via Video Note  I connected with Rebecca Odonnell on 02/03/2023 at 10:00 AM EST by a video enabled telemedicine application and verified that I am speaking with the correct person using two identifiers.  Location: Patient: home Provider: office   I discussed the limitations of evaluation and management by telemedicine and the availability of in person appointments. The patient expressed understanding and agreed to proceed.   Follow Up Instructions: I discussed the assessment and treatment plan with the patient. The patient was provided an opportunity to ask questions and all were answered. The patient agreed with the plan and demonstrated an understanding of the instructions.   The patient was advised to call back or seek an in-person evaluation if the symptoms worsen or if the condition fails to improve as anticipated.   Session Time: 45 minutes  Participation Level: Active  Behavioral Response: CasualAlertAnxious  Type of Therapy: Individual Therapy  Treatment Goals addressed: Rebecca Odonnell will reduce frequency of avoidant behaviors by 50% as evidenced by self-report in therapy sessions   ProgressTowards Goals: Progressing  Interventions: CBT and Supportive  Summary:  Rebecca Odonnell is a 22 y.o. adult who presents for the scheduled appointment oriented x 5, appropriately dressed, and friendly.  Client denied hallucinations and delusions. Client reported on today she has been doing fairly okay but having some stress.  Client reported she did not send the email off to her professors as was previously discussed in her therapy session.  Client reported she did give herself a deadline of this weekend to send the email and will have her friend to sit with her.  Client reported she has written out a tract of the email that she would like to send about her attendance in class.  Client reported she went on campus with her friend to wait on her.  Client reported she  was not able to make eye contact with anybody and felt extremely uncomfortable being on campus.  Client reported she wonders when she will ever be able to get past not talking with professors or interacting with other students.  Client reported she has extended family members from her mother side of the family.  Client reported they are able to live and states now and will be living with them for a month until they moved into their own place.  Client reported it brings back some anxiety and dread of having to revert to conservative ways of how she dresses and down to how she eats.  Client reported family members have been very judgmental of everything about her and it can cause her mom to tell her to stop embarrassing her amongst other comments as such.  Client reported before when her grandmother and cousin were staying with them it worsened her eating habits into her disorder of eating approximately like every 4 days.  Client reported she has gone to begin and it has helped to heal her relationship with food.  Client reported she thinks her family stopped asking her about it because they assume she is making the eating changes to lose weight which they often comment on as well.  Client reported today she has a job interview at [employer].  Client reported she has been compliant with the medications and has seen a lot of benefit from the Prozac.  Client reported she is sleeping 7 hours at night.  Client reported she has noticed that when she is sad she is not able to access it.  Client  reported it has sometimes for that but she works that it is helping to level out her mood. Evidence of progress towards goal: Client reported she is medication compliant 7 days a week.  Client reported 1 skill implementation of giving herself a deadline to do a task that is a priority related to her schoolwork.  Suicidal/Homicidal: Nowithout intent/plan  Therapist Response:  Therapist began the appointment asking the client how  she has been doing since last seen. Therapist used CBT to engage using active listening and positive emotional support. Therapist used CBT to ask the client about changes that are occurring with school, home, and other areas of importance. Therapist used CBT to ask the client about her thoughts and perspective on managing the stressors and how she is working to organize things. Therapist used CBT to address the clients medication concerns and benefits. Therapist used CBT to continue to engage in teach the client about progressive exposure and challenging herself. Therapist used CBT ask the client to identify her progress with frequency of use with coping skills with continued practice in her daily activity.       Plan: Return again in 4 weeks.  Diagnosis: GAD  Collaboration of Care: Patient refused AEB none.  Patient/Guardian was advised Release of Information must be obtained prior to any record release in order to collaborate their care with an outside provider. Patient/Guardian was advised if they have not already done so to contact the registration department to sign all necessary forms in order for [place] to release information regarding their care.   Consent: Patient/Guardian gives verbal consent for treatment and assignment of benefits for services provided during this visit. Patient/Guardian expressed understanding and agreed to proceed.   Neena Rhymes Shivon Hackel, LCSW 02/03/2023

## 2023-02-09 ENCOUNTER — Ambulatory Visit (HOSPITAL_COMMUNITY): Payer: BC Managed Care – PPO | Admitting: Clinical

## 2023-02-09 DIAGNOSIS — F411 Generalized anxiety disorder: Secondary | ICD-10-CM | POA: Diagnosis not present
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
## 2023-02-10 ENCOUNTER — Encounter (HOSPITAL_COMMUNITY): Payer: Self-pay | Admitting: Psychiatry

## 2023-02-10 ENCOUNTER — Telehealth (HOSPITAL_COMMUNITY): Payer: BC Managed Care – PPO | Admitting: Psychiatry

## 2023-02-10 DIAGNOSIS — F411 Generalized anxiety disorder: Secondary | ICD-10-CM

## 2023-02-10 DIAGNOSIS — F331 Major depressive disorder, recurrent, moderate: Secondary | ICD-10-CM | POA: Diagnosis not present

## 2023-02-10 DIAGNOSIS — F5089 Other specified eating disorder: Secondary | ICD-10-CM

## 2023-02-10 MED ORDER — FLUOXETINE HCL 20 MG PO CAPS: 20.0000 mg | ORAL_CAPSULE | Freq: Every day | ORAL | 2 refills | Status: DC

## 2023-02-10 NOTE — Patient Instructions (Signed)

## 2023-02-10 NOTE — Progress Notes (Signed)
   THERAPIST PROGRESS NOTE Virtual Visit via Video Note  I connected with Rebecca Odonnell on 02/09/2023 at  2:00 PM EST by a video enabled telemedicine application and verified that I am speaking with the correct person using two identifiers.  Location: Patient: home Provider: office   I discussed the limitations of evaluation and management by telemedicine and the availability of in person appointments. The patient expressed understanding and agreed to proceed.   Follow Up Instructions: I discussed the assessment and treatment plan with the patient. The patient was provided an opportunity to ask questions and all were answered. The patient agreed with the plan and demonstrated an understanding of the instructions.   The patient was advised to call back or seek an in-person evaluation if the symptoms worsen or if the condition fails to improve as anticipated.   Session Time: 30 minutes  Participation Level: Active  Behavioral Response: CasualAlertEuthymic  Type of Therapy: Individual Therapy  Treatment Goals addressed: Rebecca Odonnell will reduce frequency of avoidant behaviors by 50% as evidenced by self-report in therapy sessions   ProgressTowards Goals: Progressing  Interventions: CBT and Supportive  Summary:  Rebecca Odonnell is a 22 y.o. adult who presents for the scheduled appointment oriented x 5, appropriately dressed, and friendly.  Client denied hallucinations and delusions. Client reported on today she is doing pretty well.  Client reported she did accomplish one of her goals which was to email her professors about possibly extending the deadline for her to complete some work.  Client reported she was able to do it with the help of her friend.  Client reported she continues to think about giving herself exposure so she does not always feel anxious or afraid to interact with professors or in the class.  Client reported she did not go to the job interview at [employer]. Client reported she does  not want to work at places that she enjoys going to herself. Client reported the prozac is working very well for her. Client reported she is not overly emotional about things. Evidence of progress towards goal:  client reported 1 positive of accomplishing to goal pof emailing her professors.  Suicidal/Homicidal: Nowithout intent/plan  Therapist Response:  Therapist began the appointment asking the client how she has been doing. Therapist used cbt to engage using active listening and positive emotional support. Therapist used cbt to engage and ask the client about work and school activities mentioned. Therapist used cbt to discuss identifying her concerns and confronting avoidance. Therapist used CBT ask the client to identify her progress with frequency of use with coping skills with continued practice in her daily activity.      Plan: Return again in 4 weeks.  Diagnosis: GAD  Collaboration of Care: Patient refused AEB none requested by the client.  Patient/Guardian was advised Release of Information must be obtained prior to any record release in order to collaborate their care with an outside provider. Patient/Guardian was advised if they have not already done so to contact the registration department to sign all necessary forms in order for [place] to release information regarding their care.   Consent: Patient/Guardian gives verbal consent for treatment and assignment of benefits for services provided during this visit. Patient/Guardian expressed understanding and agreed to proceed.   Rebecca Odonnell Rebecca Ayler, LCSW 02/09/2023

## 2023-02-22 ENCOUNTER — Ambulatory Visit (INDEPENDENT_AMBULATORY_CARE_PROVIDER_SITE_OTHER): Payer: BC Managed Care – PPO | Admitting: Clinical

## 2023-02-22 DIAGNOSIS — F411 Generalized anxiety disorder: Secondary | ICD-10-CM | POA: Diagnosis not present

## 2023-02-22 NOTE — Progress Notes (Signed)
THERAPIST PROGRESS NOTE Virtual Visit via Video Note  I connected with Rebecca Odonnell on 02/22/23 at  2:00 PM EST by a video enabled telemedicine application and verified that I am speaking with the correct person using two identifiers.  Location: Patient: home Provider: office   I discussed the limitations of evaluation and management by telemedicine and the availability of in person appointments. The patient expressed understanding and agreed to proceed.   Follow Up Instructions: I discussed the assessment and treatment plan with the patient. The patient was provided an opportunity to ask questions and all were answered. The patient agreed with the plan and demonstrated an understanding of the instructions.   The patient was advised to call back or seek an in-person evaluation if the symptoms worsen or if the condition fails to improve as anticipated.   Session Time: 30 minutes  Participation Level: Active  Behavioral Response: CasualAlertEuthymic  Type of Therapy: Individual Therapy  Treatment Goals addressed: [name] will practice problem solving skills 3 times per week for the next 4 weeks.   ProgressTowards Goals: Progressing  Interventions: CBT  Summary:  Rebecca Odonnell is a 22 y.o. adult who presents for scheduled appointment oriented x 5, appropriately dressed, and friendly.  Client denied hallucinations and delusions. Client reported on today she is feeling better but the past week was a bit challenging.  Client reported she got into an argument with her mother regarding school.  Client reported her mother asked her what she was going to school for and the client tried to convey how it was triggering to her that they did not remember.  Client reported her father died in her face and was accusatory as well as disrespecting her mom.  Client reported her parents have been giving her the sound treatment.  Client reported following the argument caused her to have some passive  suicidal ideations in which she dropped it out notes that insinuated self-harm to her friends and family.  Client reported immediately upon finishing writing everything that she was she knew something that she did not believe and of harming herself considering her own experience with losing friends to that.  Client reported however she was able to have a positive reflection of the person that she was prior to COVID who was passionate about school.  Client reported her friend has helped her to get things on track for her upcoming semester but she does have to wait to see if she will be able to take some courses in a timely manner. Evidence of progress towards goal: Client reported 2 positive of grounding herself by journaling and reframing negative thoughts.  Suicidal/Homicidal: Nowithout intent/plan  Therapist Response:  Therapist again the appointment asking the client how she has been doing since last seen. Therapist used CBT to engage using active listening and positive emotional support. Therapist used CBT to get the client time to discuss her recent stressors with school and family. Therapist used CBT to positively reinforce the clients use of positive behavioral activation skills to help her regulate her emotions and stay grounded. Therapist used CBT ask the client to identify her progress with frequency of use with coping skills with continued practice in her daily activity.    Therapist assigned the client homework to practice self-care.   Plan: Return again in 4 weeks.  Diagnosis: Generalized anxiety disorder  Collaboration of Care: Patient refused AEB none requested by the client.  Patient/Guardian was advised Release of Information must be obtained prior to any record  release in order to collaborate their care with an outside provider. Patient/Guardian was advised if they have not already done so to contact the registration department to sign all necessary forms in order for [place] to  release information regarding their care.   Consent: Patient/Guardian gives verbal consent for treatment and assignment of benefits for services provided during this visit. Patient/Guardian expressed understanding and agreed to proceed.   [name], LCSW 02/22/2023

## 2023-03-10 ENCOUNTER — Ambulatory Visit (INDEPENDENT_AMBULATORY_CARE_PROVIDER_SITE_OTHER): Payer: Medicaid Other | Admitting: Clinical

## 2023-03-10 DIAGNOSIS — F411 Generalized anxiety disorder: Secondary | ICD-10-CM

## 2023-03-10 NOTE — Progress Notes (Signed)
 THERAPIST PROGRESS NOTE Virtual Visit via Video Note  I connected with Rebecca Odonnell on 03/10/23 at  1:00 PM EST by a video enabled telemedicine application and verified that I am speaking with the correct person using two identifiers.  Location: Patient: home Provider: office   I discussed the limitations of evaluation and management by telemedicine and the availability of in person appointments. The patient expressed understanding and agreed to proceed.   Follow Up Instructions: I discussed the assessment and treatment plan with the patient. The patient was provided an opportunity to ask questions and all were answered. The patient agreed with the plan and demonstrated an understanding of the instructions.   The patient was advised to call back or seek an in-person evaluation if the symptoms worsen or if the condition fails to improve as anticipated.   Session Time: 45 minutes  Participation Level: Active  Behavioral Response: CasualAlertAnxious  Type of Therapy: Individual Therapy  Treatment Goals addressed:  Rebecca Odonnell by 50% as evidenced by self-report in therapy sessions   ProgressTowards Goals: Progressing  Interventions: CBT and Supportive  Summary:  Rebecca Odonnell is a 23 y.o. adult who presents for the scheduled appointment oriented x 5, appropriately dressed, and friendly.  Rebecca Odonnell denied hallucinations and delusions. Rebecca Odonnell reported on today she is doing fairly okay.  Rebecca Odonnell reported her brother has now moved back into the home. Rebecca Odonnell reported that she is adjusting to living with him again. Rebecca Odonnell reported she had a fairly okay holiday with her parents. Rebecca Odonnell reported otherwise she has been trying to attend to some medical appointments for herself.  Rebecca Odonnell reported she recently went to the dentist after not going since childhood. Rebecca Odonnell reported it can be a challenge paying for things out-of-pocket since she is medication she may  need more co-pays.  Rebecca Odonnell reported she did experience 1 episode of having passive suicidal ideations within the past few weeks related to stress/anxiety. Rebecca Odonnell reported she initially thought that maybe she needs her Prozac  increased.  Rebecca Odonnell reported having some breakthrough anxiety symptoms pertaining to trying to figure out if she is doing everything that she can do to make things better while she still feel bad sometimes. Rebecca Odonnell reported she has been experiencing extreme acid reflux and her primary care physician has not adequately done enough investigation to help her figure out what is going on. Rebecca Odonnell reported she may go for second opinion. Rebecca Odonnell reported she did reach out to her school about allowing her to get in and complete so she can repeat a course. Rebecca Odonnell reported she is awaiting their response.  Rebecca Odonnell reported she now has a journal that she reports her thoughts and.  Rebecca Odonnell reported looking at her journal there is some things that she wrote in the moment and as of today she does not feel the same way as she did when it occurred in a positive way.   Evidence of progress towards goal: Rebecca Odonnell reported 1 negative of trying to avoid anxiety triggers.   Suicidal/Homicidal: Nowithout intent/plan  Therapist Response:  Therapist began the appointment asking the Rebecca Odonnell how she has been doing since last seen. Therapist used cbt to engage with active listening and positive emotional support. Therapist used cbt to engage and give the Rebecca Odonnell time to discuss her thoughts and feelings about family, school and other stressors. Therapist used cbt to have the Rebecca Odonnell identify thoughts/ events that triggered anxiety. Therapist used cbt to normalize her triggers and engage with her to  practice reframing her approach to processing mild symptoms of anxiety. Therapist used CBT ask the Rebecca Odonnell to identify her progress with frequency of use with coping skills with continued practice in her daily activity.     Therapist assigned the Rebecca Odonnell homework to practice the skills discussed.   Plan: Return again in 4 weeks.  Diagnosis: GAD  Collaboration of Care: Patient refused AEB none requested.  Patient/Guardian was advised Release of Information must be obtained prior to any record release in order to collaborate their care with an outside provider. Patient/Guardian was advised if they have not already done so to contact the registration department to sign all necessary forms in order for us  to release information regarding their care.   Consent: Patient/Guardian gives verbal consent for treatment and assignment of benefits for services provided during this visit. Patient/Guardian expressed understanding and agreed to proceed.   Maxie Debose Y Roni Scow, LCSW 03/10/2023

## 2023-03-24 ENCOUNTER — Encounter (HOSPITAL_COMMUNITY): Payer: Self-pay

## 2023-03-24 ENCOUNTER — Ambulatory Visit (HOSPITAL_COMMUNITY): Payer: Medicaid Other | Admitting: Clinical

## 2023-03-26 ENCOUNTER — Other Ambulatory Visit (HOSPITAL_COMMUNITY): Payer: Self-pay | Admitting: Psychiatry
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
## 2023-04-14 ENCOUNTER — Encounter (HOSPITAL_COMMUNITY): Payer: Self-pay | Admitting: Psychiatry

## 2023-04-14 ENCOUNTER — Telehealth (INDEPENDENT_AMBULATORY_CARE_PROVIDER_SITE_OTHER): Payer: Medicaid Other | Admitting: Psychiatry

## 2023-04-14 DIAGNOSIS — F5089 Other specified eating disorder: Secondary | ICD-10-CM

## 2023-04-14 DIAGNOSIS — F411 Generalized anxiety disorder: Secondary | ICD-10-CM | POA: Diagnosis not present

## 2023-04-14 DIAGNOSIS — F331 Major depressive disorder, recurrent, moderate: Secondary | ICD-10-CM | POA: Diagnosis not present

## 2023-04-14 MED ORDER — FLUOXETINE HCL 40 MG PO CAPS: 40.0000 mg | ORAL_CAPSULE | Freq: Every day | ORAL | 2 refills | Status: DC

## 2023-04-14 NOTE — Patient Instructions (Signed)
Thank you for attending your appointment today.  -- INCREASE Prozac to 40 mg daily -- Continue other medications as prescribed.  Please do not make any changes to medications without first discussing with your provider. If you are experiencing a psychiatric emergency, please call 911 or present to your nearest emergency department. Additional crisis, medication management, and therapy resources are included below.  [facility]  [address], [place], [place] [address] [phone] WALK-IN URGENT CARE 24/7 FOR ANYONE [address], [place], [place]  [phone] Fax: [phone] [email or web address] *Interpreters available *Accepts all insurance and uninsured for Urgent Care needs *Accepts Medicaid and uninsured for outpatient treatment (below)      ONLY FOR [facility]  Below:    Outpatient New Patient Assessment/Therapy Walk-ins:        Monday, Wednesday, and Thursday 8am until slots are full (first come, first served)                   New Patient Psychiatry/Medication Management        Monday-Friday 8am-11am (first come, first served)               For all walk-ins we ask that you arrive by 7:15am, because patients will be seen in the order of arrival.

## 2023-04-22 ENCOUNTER — Telehealth (HOSPITAL_COMMUNITY): Payer: Self-pay | Admitting: Clinical

## 2023-05-06 ENCOUNTER — Other Ambulatory Visit (HOSPITAL_COMMUNITY): Payer: Self-pay | Admitting: Psychiatry

## 2023-05-07 ENCOUNTER — Ambulatory Visit (HOSPITAL_COMMUNITY): Payer: Medicaid Other | Admitting: Clinical

## 2023-05-07 DIAGNOSIS — F411 Generalized anxiety disorder: Secondary | ICD-10-CM

## 2023-05-07 NOTE — Progress Notes (Signed)
   THERAPIST PROGRESS NOTE Virtual Visit via Video Note  I connected with Rebecca Odonnell on 05/07/23 at 11:00 AM EST by a video enabled telemedicine application and verified that I am speaking with the correct person using two identifiers.  Location: Patient: home Provider: office   I discussed the limitations of evaluation and management by telemedicine and the availability of in person appointments. The patient expressed understanding and agreed to proceed.   Follow Up Instructions: I discussed the assessment and treatment plan with the patient. The patient was provided an opportunity to ask questions and all were answered. The patient agreed with the plan and demonstrated an understanding of the instructions.   The patient was advised to call back or seek an in-person evaluation if the symptoms worsen or if the condition fails to improve as anticipated.   Session Time: 40 minutes  Participation Level: Active  Behavioral Response: CasualAlertEuthymic  Type of Therapy: Individual Therapy  Treatment Goals addressed: Rebecca Odonnell will score less than 5 on the Generalized Anxiety Disorder 7 Scale (GAD-7)   ProgressTowards Goals: Progressing  Interventions: CBT and Supportive  Summary:  Rebecca Odonnell is a 23 y.o. adult who presents for the scheduled appointment oriented times five, appropriately dressed and friendly. Client denied hallucinations and delusions. Client reported she is doing a little better. Client reported she had a falling out with her parents a few weeks ago and was kicked out of the home. Client reported she stayed with friends. Client reported she is now back home and the relationship with her parents still feels weird. 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 Client reported she is working on having her schedule finalized for school in the fall. Client reported her job is giving her a positive outlet to interact with other  people. Evidence of progress towards goal:  client reported 1 positive of obtaining employment.   Suicidal/Homicidal: Nowithout intent/plan  Therapist Response:  Therapist began the appointment asking the client how she has been doing. Therapist used cbt to engage with active listening and positive emotional support. Therapist used cbt to engage and give the client time to discuss her changes and how she is processing/ coping. Therapist used cbt to engage with the client to highlight her strengths. Therapist used CBT ask the client to identify her progress with frequency of use with coping skills with continued practice in her daily activity.    Therapist assigned the client homework to practice self care.   Plan: Return again in 4 weeks.  Diagnosis: GAD  Collaboration of Care: Patient refused AEB none requested by the client.  Patient/Guardian was advised Release of Information must be obtained prior to any record release in order to collaborate their care with an outside provider. Patient/Guardian was advised if they have not already done so to contact the registration department to sign all necessary forms in order for [place] to release information regarding their care.   Consent: Patient/Guardian gives verbal consent for treatment and assignment of benefits for services provided during this visit. Patient/Guardian expressed understanding and agreed to proceed.   Rebecca Rhymes Leonidus Rowand, LCSW 05/07/2023

## 2023-05-19 ENCOUNTER — Ambulatory Visit (INDEPENDENT_AMBULATORY_CARE_PROVIDER_SITE_OTHER): Admitting: Clinical

## 2023-05-19 DIAGNOSIS — F331 Major depressive disorder, recurrent, moderate: Secondary | ICD-10-CM | POA: Diagnosis not present

## 2023-05-19 NOTE — Progress Notes (Signed)
 THERAPIST PROGRESS NOTE Virtual Visit via Video Note  I connected with Rebecca Odonnell on 05/19/2023 at  3:00 PM EDT by a video enabled telemedicine application and verified that I am speaking with the correct person using two identifiers.  Location: Patient: home Provider: office   I discussed the limitations of evaluation and management by telemedicine and the availability of in person appointments. The patient expressed understanding and agreed to proceed.   Follow Up Instructions: I discussed the assessment and treatment plan with the patient. The patient was provided an opportunity to ask questions and all were answered. The patient agreed with the plan and demonstrated an understanding of the instructions.   The patient was advised to call back or seek an in-person evaluation if the symptoms worsen or if the condition fails to improve as anticipated.   Session Time: 45 minutes  Participation Level: Active  Behavioral Response: CasualAlertEuthymic  Type of Therapy: Individual Therapy  Treatment Goals addressed: Rebecca Odonnell will score less than 5 on the Generalized Anxiety Disorder 7 Scale (GAD-7)   ProgressTowards Goals: Progressing  Interventions: CBT  Summary:  Rebecca Odonnell is a 23 y.o. adult who presents for the scheduled appointment oriented times five, appropriately dressed and friendly.  Client denied hallucinations and delusions. Client reported on today she is doing fairly okay.  Client reported since the last therapy appointment she is now back out of her parents house.  Client reported they got mad about the same usual things that they tried to argue with her about.  Client reported she is at the house with very minimal and tries to get things from the home when her parents are not there.  Client reported she has continued to work her job at [employer] which is going very well.  Client reported she will start her second job tomorrow.  Client reported she thinks that being  put out of her parents house is ultimately a good thing.  Client reported she does not think she would have separated herself from her parents on her own unless something drastic happened.  Client reported her primary focus is to save money and hopefully get her own place sooner than later. Evidence of progress towards goal:  client reported 1 positive of practicing conflict resolution and problem solving skills.  Suicidal/Homicidal: Nowithout intent/plan  Therapist Response:  Therapist began the appointment asking the client how she has been doing. Therapist used cbt to engage with active listening and positive emotional support. Therapist used cbt to engage and ask the client about her severity of depression and/ or anxiety symptoms related to stressors. Therapist used cbt to positively reinforce the clients use of positive social skills and activity engagement. Therapist used CBT ask the client to identify her progress with frequency of use with coping skills with continued practice in her daily activity.    Therapist assigned the client homework to practice self care.    Plan: Return again in 4 weeks.  Diagnosis: mdd, recurrent episode, moderate  Collaboration of Care: Patient refused AEB none requested by the client.  Patient/Guardian was advised Release of Information must be obtained prior to any record release in order to collaborate their care with an outside provider. Patient/Guardian was advised if they have not already done so to contact the registration department to sign all necessary forms in order for [place] to release information regarding their care.   Consent: Patient/Guardian gives verbal consent for treatment and assignment of benefits for services provided during this visit. Patient/Guardian  expressed understanding and agreed to proceed.   Rebecca Rhymes Pressley Tadesse, LCSW 05/19/2023

## 2023-06-09 ENCOUNTER — Telehealth (HOSPITAL_COMMUNITY): Payer: Medicaid Other | Admitting: Psychiatry

## 2023-12-30 ENCOUNTER — Encounter (HOSPITAL_COMMUNITY): Payer: Self-pay

## 2023-12-31 ENCOUNTER — Encounter (HOSPITAL_COMMUNITY): Payer: Self-pay
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
## 2023-12-31 NOTE — Progress Notes (Signed)
 [facility] OB/GYN Gynecologic New Visit   ASSESSMENT & PLAN:  Problem List Items Addressed This Visit     Encounter for other general counseling and advice on contraception - Primary   Overview   Interested in Nexplanon.  Discussed irregular bleeding that can be associated. RTC with next menses for Nexplanon placement        Return for Nexplanon placement. She will call with next menses..  CHIEF COMPLAINT: No chief complaint on file.   DISCUSSED: Rebecca Odonnell is a 23 y.o. G0P0000 who presents today for a new Gyn visit.  We discussed preventative health maintenance and healthy lifestyle choices appropriate to her age group.  The ASCCP Pap Smear Guidelines delineating the frequency of Pap smear testing were followed today.  The importance of annual exams was stressed.   Today, she is here to discuss contraception.  She is most interested in Nexplanon.  We discussed the risks and benefits of this, including the irregular bleeding that can be associated.  As she is currently in the luteal phase of this cycle, cannot place today.  I will have her return to the office with her next menses for Nexplanon placement.  All of Rebecca Odonnell's questions were answered today.    PERTINENT OB/GYN HISTORY:  (findings are per pt, as reviewed by CMA at the beginning of today's visit;  findings personally confirmed by me)    LMP:                                        Patient's last menstrual period was 12/11/2023 (exact date). LAST PAP SMEAR:                Date: Never Does pt have a history of abnormal pap smears: No history of abnormal pap smear If history of abnormal, how was it treated:             No treatment MAMMOGRAM:                      Not of appropriate age COLONOSCOPY:                   Not of appropriate age     CONTRACEPTION:                Combined (regular) OCP's MENSES:                                Monthly and normal, regular and predictable HX OF STD'S:                          No reported STD's SEXUALLY ACTIVE:              Currently sexually active with  One partner RELATIONSHIP:                    Dating and Monogomous DOMESTIC VIOLENCE:        Domestic Violence: No    HEALTH MAINTENANCE: (as per Stage Manager) Health Maintenance Status       Date Due Completion Dates   Chlamydia and Gonorrhea Screening Never done ---   HIV Screening Never done ---   Hepatitis C Screening Never done ---   DTaP/Tdap/Td Vaccines (7 - Td or Tdap) 10/14/2021 10/15/2011,  11/17/2005   PAP SMEAR Never done ---   Comprehensive Annual Visit 04/17/2023 04/16/2022, 09/12/2019   Depression Monitoring 08/13/2023 02/12/2023   Influenza Vaccine (1) Never done ---   COVID-19 Vaccine (6 - 2025-26 season) 11/01/2023 05/14/2020, 06/12/2019   Adult RSV (50+ Years or Pregnancy) (1 - 1-dose 75+ series) 11/14/2075 ---       MEDICATIONS: Current Medications[1]  PAST MEDICAL HISTORY: Medical History[2]  PAST SURGICAL HISTORY: Surgical History[3]  OBSTETRIC HISTORY:  OB History  Gravida Para Term Preterm AB Living  0 0 0 0 0 0  SAB IAB Ectopic Molar Multiple Live Births  0 0 0 0 0 0     REVIEW OF SYSTEMS:  GENERAL: no new fevers, chills, or significant changes in weight  BREAST: no pain, masses or discharge RESPIRATORY: no new cough, channge in her work of breathing or new shortness of breath CARDIOVASCULAR: no new onset chest pain or palpitations GASTROINTESTINAL: no new N/V/D, regular bowel movements, no abdominal pain GENITOURINARY: no dysuria, no bowel or bladder incontinence, no vaginal discharge PSYCHIATRY: no recent changes in mood, no anxiety/depression.    OBJECTIVE: BP 102/62   Wt 59.4 kg (131 lb)   LMP 12/11/2023 (Exact Date)   BMI 23.21 kg/m    Physical Exam-- General: well developed, well nourished, in no acute distress Resp: even and unlabored Psych: Normal mood and affect. All responses appropriate   I have personally spent >20 minutes involved in  face-to-face and non-face-to-face activities for this patient on the day of the visit.  Professional time spent includes the following activities, in addition to those noted in the documentation:  -reviewing her medications, allergies, her medical, surgical, obstetric, gynecologic, and family history in preparation for the patient's visit,  -in counseling and educating the patient and/or family/caregiver if present,  -in ordering medications, test, or procedures if necessary,  -and in documentation after patient has checked out from office.           [1] Current Outpatient Medications  Medication Sig Dispense Refill  . desogestreL-ethinyl estradioL 0.15-0.03 mg tab Take 1 tablet by mouth daily. 84 tablet 3  . FLUoxetine  (PROzac ) 20 mg capsule Take 20 mg by mouth daily.    . metFORMIN (GLUCOPHAGE-XR) 500 mg 24 hr tablet TAKE 1 TABLET BY MOUTH EVERY DAY WITH BREAKFAST 90 tablet 1  . omeprazole (PriLOSEC) 20 mg DR capsule TAKE 1 CAPSULE BY MOUTH EVERY DAY 90 capsule 1   No current facility-administered medications for this visit.  [2] Past Medical History: Diagnosis Date  . Anxiety   . Depression   [3] Past Surgical History: Procedure Laterality Date  . RHINOPLASTY  2022
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
## 2024-01-06 ENCOUNTER — Encounter (HOSPITAL_COMMUNITY): Payer: Self-pay | Admitting: Psychiatry

## 2024-01-06 ENCOUNTER — Telehealth (INDEPENDENT_AMBULATORY_CARE_PROVIDER_SITE_OTHER): Admitting: Psychiatry

## 2024-01-06 DIAGNOSIS — F411 Generalized anxiety disorder: Secondary | ICD-10-CM | POA: Diagnosis not present

## 2024-01-06 DIAGNOSIS — F5089 Other specified eating disorder: Secondary | ICD-10-CM | POA: Diagnosis not present

## 2024-01-06 DIAGNOSIS — F330 Major depressive disorder, recurrent, mild: Secondary | ICD-10-CM

## 2024-01-06 MED ORDER — FLUOXETINE HCL 20 MG PO CAPS: 20.0000 mg | ORAL_CAPSULE | Freq: Every day | ORAL | 2 refills | Status: AC

## 2024-01-06 NOTE — Patient Instructions (Signed)
 Thank you for attending your appointment today.  -- RESTART Prozac  at 20 mg daily -- Continue other medications as prescribed.  Please do not make any changes to medications without first discussing with your provider. If you are experiencing a psychiatric emergency, please call 911 or present to your nearest emergency department. Additional crisis, medication management, and therapy resources are included below.  [facility]  [address] [place], [place] [address] [phone] WALK-IN URGENT CARE 24/7 FOR ANYONE [address] [place], [place]  [phone] Fax: [phone] [email or web address] *Interpreters available *Accepts all insurance and uninsured for Urgent Care needs *Accepts Medicaid and uninsured for outpatient treatment (below)      ONLY FOR [facility]  Below:    Outpatient New Patient Assessment/Therapy Walk-ins:        Monday, Wednesday, and Thursday 8am until slots are full (first come, first served)                   New Patient Psychiatry/Medication Management        Monday-Friday 8am-11am (first come, first served)               For all walk-ins we ask that you arrive by 7:15am, because patients will be seen in the order of arrival.

## 2024-01-07 ENCOUNTER — Ambulatory Visit (INDEPENDENT_AMBULATORY_CARE_PROVIDER_SITE_OTHER): Admitting: Clinical

## 2024-01-07 DIAGNOSIS — F330 Major depressive disorder, recurrent, mild: Secondary | ICD-10-CM | POA: Diagnosis not present

## 2024-01-07 NOTE — Progress Notes (Signed)
   THERAPIST PROGRESS NOTE Virtual Visit via Video Note  I connected with Rebecca Odonnell on 01/07/24 at  9:00 AM EST by a video enabled telemedicine application and verified that I am speaking with the correct person using two identifiers.  Location: Patient: home Provider: office   I discussed the limitations of evaluation and management by telemedicine and the availability of in person appointments. The patient expressed understanding and agreed to proceed.   Follow Up Instructions: I discussed the assessment and treatment plan with the patient. The patient was provided an opportunity to ask questions and all were answered. The patient agreed with the plan and demonstrated an understanding of the instructions.   The patient was advised to call back or seek an in-person evaluation if the symptoms worsen or if the condition fails to improve as anticipated.    Session Time: 60 min  Participation Level: Active  Behavioral Response: CasualAlertEuthymic  Type of Therapy: Individual Therapy  Treatment Goals addressed: client will identify 3 treatment goals for therapy  ProgressTowards Goals: Progressing  Interventions: CBT  Summary:  Rebecca Odonnell is a 23 y.o. adult who presents for the scheduled appointment oriented times five, appropriately dressed and friendly. Client denied hallucinations and delusions. Client reported she is reengaging in her outpatient services because her parents have put her on their insurance. Client reported there has been a lot of good changes. Client reported she has quit both of her jobs. Client reported she did not like managements beliefs and how they treated clientele at either location. Client reported otherwise her decision to quit work is because she wants to enroll back in school and focus on that. Client reported it feels weird because are parents are being nice and offering to pay her an allowance while she is in school. Client reported she also has a  boyfriend now who is 2 years younger than her but they get along well. Evidence of progress towards goal:  client reported 1 goal she wants to accomplish in her personal life.  Suicidal/Homicidal: Nowithout intent/plan  Therapist Response:  Therapist began the appointment asking the client how she has been doing. Therapist engaged with active listening and positive emotional support. Therapist used cbt to engage and ask her about changes and any negative mental health symptoms. Therapist used cbt to discuss updated therapy goals. Therapist used CBT ask the client to identify her progress with frequency of use with coping skills with continued practice in her daily activity.    Therapist assigned her homework to practice self care.   Plan: Return again in 4 weeks.  Diagnosis: mdd, recurrent episode, mild  Collaboration of Care: Patient refused AEB none requested by the client.  Patient/Guardian was advised Release of Information must be obtained prior to any record release in order to collaborate their care with an outside provider. Patient/Guardian was advised if they have not already done so to contact the registration department to sign all necessary forms in order for us  to release information regarding their care.   Consent: Patient/Guardian gives verbal consent for treatment and assignment of benefits for services provided during this visit. Patient/Guardian expressed understanding and agreed to proceed.   Zoraya Fiorenza Y Maitland Lesiak, LCSW 01/07/2024

## 2024-02-17 ENCOUNTER — Encounter (HOSPITAL_COMMUNITY): Admitting: Student in an Organized Health Care Education/Training Program

## 2024-02-17 ENCOUNTER — Encounter (HOSPITAL_COMMUNITY): Payer: Self-pay
